# Patient Record
Sex: Female | Born: 1970 | Race: Black or African American | Hispanic: No | Marital: Single | Smoking: Never smoker
Health system: Southern US, Community
[De-identification: ages and names within clinical notes are randomized; demographics above are authoritative.]

## PROBLEM LIST (undated history)

## (undated) DIAGNOSIS — I10 Essential (primary) hypertension: Secondary | ICD-10-CM

## (undated) DIAGNOSIS — R7303 Prediabetes: Secondary | ICD-10-CM

## (undated) HISTORY — PX: ANKLE ARTHROSCOPY: SHX545

## (undated) HISTORY — DX: Essential (primary) hypertension: I10

## (undated) HISTORY — PX: WRIST ARTHROPLASTY: SHX1088

## (undated) HISTORY — PX: ABDOMINAL HYSTERECTOMY: SHX81

## (undated) HISTORY — DX: Prediabetes: R73.03

---

## 2000-08-08 ENCOUNTER — Other Ambulatory Visit: Admission: RE | Admit: 2000-08-08 | Discharge: 2000-08-08 | Payer: Self-pay | Admitting: Obstetrics and Gynecology

## 2000-09-18 ENCOUNTER — Ambulatory Visit (HOSPITAL_COMMUNITY): Admission: RE | Admit: 2000-09-18 | Discharge: 2000-09-18 | Payer: Self-pay | Admitting: Obstetrics and Gynecology

## 2000-09-18 ENCOUNTER — Encounter (INDEPENDENT_AMBULATORY_CARE_PROVIDER_SITE_OTHER): Payer: Self-pay | Admitting: Specialist

## 2000-12-31 ENCOUNTER — Other Ambulatory Visit: Admission: RE | Admit: 2000-12-31 | Discharge: 2000-12-31 | Payer: Self-pay | Admitting: Obstetrics and Gynecology

## 2001-10-30 ENCOUNTER — Other Ambulatory Visit: Admission: RE | Admit: 2001-10-30 | Discharge: 2001-10-30 | Payer: Self-pay | Admitting: Obstetrics and Gynecology

## 2004-04-24 ENCOUNTER — Other Ambulatory Visit: Admission: RE | Admit: 2004-04-24 | Discharge: 2004-04-24 | Payer: Self-pay | Admitting: Family Medicine

## 2005-07-08 ENCOUNTER — Other Ambulatory Visit: Admission: RE | Admit: 2005-07-08 | Discharge: 2005-07-08 | Payer: Self-pay | Admitting: Obstetrics and Gynecology

## 2005-07-17 ENCOUNTER — Other Ambulatory Visit: Admission: RE | Admit: 2005-07-17 | Discharge: 2005-07-17 | Payer: Self-pay | Admitting: Family Medicine

## 2006-04-09 ENCOUNTER — Encounter: Admission: RE | Admit: 2006-04-09 | Discharge: 2006-04-09 | Payer: Self-pay | Admitting: Obstetrics & Gynecology

## 2007-04-13 ENCOUNTER — Encounter: Admission: RE | Admit: 2007-04-13 | Discharge: 2007-04-13 | Payer: Self-pay | Admitting: *Deleted

## 2010-09-09 ENCOUNTER — Encounter: Payer: Self-pay | Admitting: Obstetrics & Gynecology

## 2011-01-04 NOTE — Op Note (Signed)
[facility]  Patient:    Brandy Dunn, Brandy Dunn                         MRN: [id number] Proc. Date: 09/18/00 Adm. Date:  [id number] Attending:  Michaele Offer CC:         Charles A. Tenny Craw, M.D.                           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  OPERATION PERFORMED:  LEEP of the cervix.  SURGEON:  Zenaida Niece, M.D.  ANESTHESIA:  Paracervical block with 2% lidocaine.  DESCRIPTION OF PROCEDURE:  After appropriate informed consent was obtained, the patient was taken to the minor procedure room and placed in high stirrups. An insulated speculum was inserted into the vagina and hooked up to suction. Vaginal fornices were prepped with Betadine and paracervical block was performed.  The cervix was then swabbed with acetic acid.  The cervix was found to have adequate anesthesia and there were no significant acetowhite areas.  A LEEP specimen was then removed with a fairly small loop to remove the entire transition zone.  This achieved good results with a fairly small specimen.  The entire LEEP bed was then coagulated with the ball electrode to assure hemostasis.  The endocervical canal was then probed with the uterine sound to maintain patency.  The patient tolerated the procedure well and was taken down from stirrups and discharged home in stable condition. DD:  09/18/00 TD:  09/18/00 Job: [id number]

## 2016-09-17 DIAGNOSIS — R87612 Low grade squamous intraepithelial lesion on cytologic smear of cervix (LGSIL): Secondary | ICD-10-CM | POA: Diagnosis not present

## 2016-10-09 DIAGNOSIS — R87610 Atypical squamous cells of undetermined significance on cytologic smear of cervix (ASC-US): Secondary | ICD-10-CM | POA: Diagnosis not present

## 2016-10-09 DIAGNOSIS — N912 Amenorrhea, unspecified: Secondary | ICD-10-CM | POA: Diagnosis not present

## 2016-11-04 DIAGNOSIS — M9904 Segmental and somatic dysfunction of sacral region: Secondary | ICD-10-CM | POA: Diagnosis not present

## 2016-11-04 DIAGNOSIS — M5137 Other intervertebral disc degeneration, lumbosacral region: Secondary | ICD-10-CM | POA: Diagnosis not present

## 2016-11-04 DIAGNOSIS — M9903 Segmental and somatic dysfunction of lumbar region: Secondary | ICD-10-CM | POA: Diagnosis not present

## 2016-12-29 DIAGNOSIS — S82841A Displaced bimalleolar fracture of right lower leg, initial encounter for closed fracture: Secondary | ICD-10-CM | POA: Diagnosis not present

## 2016-12-29 DIAGNOSIS — W1839XA Other fall on same level, initial encounter: Secondary | ICD-10-CM | POA: Diagnosis not present

## 2016-12-29 DIAGNOSIS — Z888 Allergy status to other drugs, medicaments and biological substances status: Secondary | ICD-10-CM | POA: Diagnosis not present

## 2016-12-29 DIAGNOSIS — S82851A Displaced trimalleolar fracture of right lower leg, initial encounter for closed fracture: Secondary | ICD-10-CM | POA: Diagnosis not present

## 2016-12-29 DIAGNOSIS — M25571 Pain in right ankle and joints of right foot: Secondary | ICD-10-CM | POA: Diagnosis not present

## 2016-12-30 DIAGNOSIS — S8261XA Displaced fracture of lateral malleolus of right fibula, initial encounter for closed fracture: Secondary | ICD-10-CM | POA: Diagnosis not present

## 2017-01-07 DIAGNOSIS — G8918 Other acute postprocedural pain: Secondary | ICD-10-CM | POA: Diagnosis not present

## 2017-01-07 DIAGNOSIS — S93421A Sprain of deltoid ligament of right ankle, initial encounter: Secondary | ICD-10-CM | POA: Diagnosis not present

## 2017-01-07 DIAGNOSIS — S82841A Displaced bimalleolar fracture of right lower leg, initial encounter for closed fracture: Secondary | ICD-10-CM | POA: Diagnosis not present

## 2017-01-22 DIAGNOSIS — S8261XD Displaced fracture of lateral malleolus of right fibula, subsequent encounter for closed fracture with routine healing: Secondary | ICD-10-CM | POA: Diagnosis not present

## 2017-03-24 DIAGNOSIS — S8261XD Displaced fracture of lateral malleolus of right fibula, subsequent encounter for closed fracture with routine healing: Secondary | ICD-10-CM | POA: Diagnosis not present

## 2017-03-31 DIAGNOSIS — M5137 Other intervertebral disc degeneration, lumbosacral region: Secondary | ICD-10-CM | POA: Diagnosis not present

## 2017-03-31 DIAGNOSIS — M9903 Segmental and somatic dysfunction of lumbar region: Secondary | ICD-10-CM | POA: Diagnosis not present

## 2017-03-31 DIAGNOSIS — M9904 Segmental and somatic dysfunction of sacral region: Secondary | ICD-10-CM | POA: Diagnosis not present

## 2017-04-07 DIAGNOSIS — M25671 Stiffness of right ankle, not elsewhere classified: Secondary | ICD-10-CM | POA: Diagnosis not present

## 2017-04-10 DIAGNOSIS — M25671 Stiffness of right ankle, not elsewhere classified: Secondary | ICD-10-CM | POA: Diagnosis not present

## 2017-04-15 DIAGNOSIS — M25671 Stiffness of right ankle, not elsewhere classified: Secondary | ICD-10-CM | POA: Diagnosis not present

## 2017-04-17 DIAGNOSIS — M25671 Stiffness of right ankle, not elsewhere classified: Secondary | ICD-10-CM | POA: Diagnosis not present

## 2017-04-18 DIAGNOSIS — M5137 Other intervertebral disc degeneration, lumbosacral region: Secondary | ICD-10-CM | POA: Diagnosis not present

## 2017-04-18 DIAGNOSIS — M9904 Segmental and somatic dysfunction of sacral region: Secondary | ICD-10-CM | POA: Diagnosis not present

## 2017-04-18 DIAGNOSIS — M9903 Segmental and somatic dysfunction of lumbar region: Secondary | ICD-10-CM | POA: Diagnosis not present

## 2017-04-22 DIAGNOSIS — M25671 Stiffness of right ankle, not elsewhere classified: Secondary | ICD-10-CM | POA: Diagnosis not present

## 2017-04-24 DIAGNOSIS — M25671 Stiffness of right ankle, not elsewhere classified: Secondary | ICD-10-CM | POA: Diagnosis not present

## 2017-04-28 DIAGNOSIS — S8261XD Displaced fracture of lateral malleolus of right fibula, subsequent encounter for closed fracture with routine healing: Secondary | ICD-10-CM | POA: Diagnosis not present

## 2017-04-29 DIAGNOSIS — M25671 Stiffness of right ankle, not elsewhere classified: Secondary | ICD-10-CM | POA: Diagnosis not present

## 2017-05-01 DIAGNOSIS — M25671 Stiffness of right ankle, not elsewhere classified: Secondary | ICD-10-CM | POA: Diagnosis not present

## 2017-05-06 DIAGNOSIS — M25671 Stiffness of right ankle, not elsewhere classified: Secondary | ICD-10-CM | POA: Diagnosis not present

## 2017-05-08 DIAGNOSIS — M25671 Stiffness of right ankle, not elsewhere classified: Secondary | ICD-10-CM | POA: Diagnosis not present

## 2017-05-13 DIAGNOSIS — M25671 Stiffness of right ankle, not elsewhere classified: Secondary | ICD-10-CM | POA: Diagnosis not present

## 2017-05-15 DIAGNOSIS — M25671 Stiffness of right ankle, not elsewhere classified: Secondary | ICD-10-CM | POA: Diagnosis not present

## 2017-05-20 DIAGNOSIS — M25671 Stiffness of right ankle, not elsewhere classified: Secondary | ICD-10-CM | POA: Diagnosis not present

## 2017-05-21 DIAGNOSIS — Z23 Encounter for immunization: Secondary | ICD-10-CM | POA: Diagnosis not present

## 2017-05-22 DIAGNOSIS — M25671 Stiffness of right ankle, not elsewhere classified: Secondary | ICD-10-CM | POA: Diagnosis not present

## 2017-05-26 DIAGNOSIS — S8261XD Displaced fracture of lateral malleolus of right fibula, subsequent encounter for closed fracture with routine healing: Secondary | ICD-10-CM | POA: Diagnosis not present

## 2017-05-26 DIAGNOSIS — Z09 Encounter for follow-up examination after completed treatment for conditions other than malignant neoplasm: Secondary | ICD-10-CM | POA: Diagnosis not present

## 2017-06-06 DIAGNOSIS — M9903 Segmental and somatic dysfunction of lumbar region: Secondary | ICD-10-CM | POA: Diagnosis not present

## 2017-06-06 DIAGNOSIS — M9904 Segmental and somatic dysfunction of sacral region: Secondary | ICD-10-CM | POA: Diagnosis not present

## 2017-06-06 DIAGNOSIS — M5137 Other intervertebral disc degeneration, lumbosacral region: Secondary | ICD-10-CM | POA: Diagnosis not present

## 2017-06-16 DIAGNOSIS — Z01419 Encounter for gynecological examination (general) (routine) without abnormal findings: Secondary | ICD-10-CM | POA: Diagnosis not present

## 2017-06-24 DIAGNOSIS — M9904 Segmental and somatic dysfunction of sacral region: Secondary | ICD-10-CM | POA: Diagnosis not present

## 2017-06-24 DIAGNOSIS — M5137 Other intervertebral disc degeneration, lumbosacral region: Secondary | ICD-10-CM | POA: Diagnosis not present

## 2017-06-24 DIAGNOSIS — M9903 Segmental and somatic dysfunction of lumbar region: Secondary | ICD-10-CM | POA: Diagnosis not present

## 2017-07-22 DIAGNOSIS — R030 Elevated blood-pressure reading, without diagnosis of hypertension: Secondary | ICD-10-CM | POA: Diagnosis not present

## 2017-07-23 DIAGNOSIS — M5137 Other intervertebral disc degeneration, lumbosacral region: Secondary | ICD-10-CM | POA: Diagnosis not present

## 2017-07-23 DIAGNOSIS — M9903 Segmental and somatic dysfunction of lumbar region: Secondary | ICD-10-CM | POA: Diagnosis not present

## 2017-07-23 DIAGNOSIS — M9904 Segmental and somatic dysfunction of sacral region: Secondary | ICD-10-CM | POA: Diagnosis not present

## 2017-08-13 DIAGNOSIS — M9903 Segmental and somatic dysfunction of lumbar region: Secondary | ICD-10-CM | POA: Diagnosis not present

## 2017-08-13 DIAGNOSIS — M5137 Other intervertebral disc degeneration, lumbosacral region: Secondary | ICD-10-CM | POA: Diagnosis not present

## 2017-08-13 DIAGNOSIS — M9904 Segmental and somatic dysfunction of sacral region: Secondary | ICD-10-CM | POA: Diagnosis not present

## 2017-08-14 DIAGNOSIS — M2021 Hallux rigidus, right foot: Secondary | ICD-10-CM | POA: Diagnosis not present

## 2017-12-22 DIAGNOSIS — R87610 Atypical squamous cells of undetermined significance on cytologic smear of cervix (ASC-US): Secondary | ICD-10-CM | POA: Diagnosis not present

## 2018-04-24 DIAGNOSIS — M5137 Other intervertebral disc degeneration, lumbosacral region: Secondary | ICD-10-CM | POA: Diagnosis not present

## 2018-04-24 DIAGNOSIS — M9904 Segmental and somatic dysfunction of sacral region: Secondary | ICD-10-CM | POA: Diagnosis not present

## 2018-04-24 DIAGNOSIS — M9903 Segmental and somatic dysfunction of lumbar region: Secondary | ICD-10-CM | POA: Diagnosis not present

## 2018-05-07 DIAGNOSIS — Z23 Encounter for immunization: Secondary | ICD-10-CM | POA: Diagnosis not present

## 2018-05-25 DIAGNOSIS — M5137 Other intervertebral disc degeneration, lumbosacral region: Secondary | ICD-10-CM | POA: Diagnosis not present

## 2018-05-25 DIAGNOSIS — M9903 Segmental and somatic dysfunction of lumbar region: Secondary | ICD-10-CM | POA: Diagnosis not present

## 2018-05-25 DIAGNOSIS — M9904 Segmental and somatic dysfunction of sacral region: Secondary | ICD-10-CM | POA: Diagnosis not present

## 2018-07-27 DIAGNOSIS — M5137 Other intervertebral disc degeneration, lumbosacral region: Secondary | ICD-10-CM | POA: Diagnosis not present

## 2018-07-27 DIAGNOSIS — M9904 Segmental and somatic dysfunction of sacral region: Secondary | ICD-10-CM | POA: Diagnosis not present

## 2018-07-27 DIAGNOSIS — M9903 Segmental and somatic dysfunction of lumbar region: Secondary | ICD-10-CM | POA: Diagnosis not present

## 2018-08-03 DIAGNOSIS — Z01419 Encounter for gynecological examination (general) (routine) without abnormal findings: Secondary | ICD-10-CM | POA: Diagnosis not present

## 2018-08-03 DIAGNOSIS — Z6841 Body Mass Index (BMI) 40.0 and over, adult: Secondary | ICD-10-CM | POA: Diagnosis not present

## 2018-08-04 ENCOUNTER — Other Ambulatory Visit: Payer: Self-pay | Admitting: Obstetrics & Gynecology

## 2018-08-04 DIAGNOSIS — R928 Other abnormal and inconclusive findings on diagnostic imaging of breast: Secondary | ICD-10-CM

## 2018-08-17 ENCOUNTER — Other Ambulatory Visit: Payer: Self-pay

## 2018-08-21 ENCOUNTER — Ambulatory Visit
Admission: RE | Admit: 2018-08-21 | Discharge: 2018-08-21 | Disposition: A | Discharge status: Home / Self Care | Payer: 59 | Source: Ambulatory Visit | Attending: Obstetrics & Gynecology | Admitting: Obstetrics & Gynecology

## 2018-08-21 DIAGNOSIS — R928 Other abnormal and inconclusive findings on diagnostic imaging of breast: Secondary | ICD-10-CM

## 2018-08-21 DIAGNOSIS — N6012 Diffuse cystic mastopathy of left breast: Secondary | ICD-10-CM | POA: Diagnosis not present

## 2018-08-31 DIAGNOSIS — R29898 Other symptoms and signs involving the musculoskeletal system: Secondary | ICD-10-CM | POA: Diagnosis not present

## 2018-08-31 DIAGNOSIS — M5430 Sciatica, unspecified side: Secondary | ICD-10-CM | POA: Diagnosis not present

## 2018-10-13 DIAGNOSIS — M67439 Ganglion, unspecified wrist: Secondary | ICD-10-CM | POA: Insufficient documentation

## 2018-10-13 DIAGNOSIS — M67432 Ganglion, left wrist: Secondary | ICD-10-CM | POA: Diagnosis not present

## 2018-10-28 DIAGNOSIS — D251 Intramural leiomyoma of uterus: Secondary | ICD-10-CM | POA: Diagnosis not present

## 2018-10-28 DIAGNOSIS — D252 Subserosal leiomyoma of uterus: Secondary | ICD-10-CM | POA: Diagnosis not present

## 2019-05-04 DIAGNOSIS — D1720 Benign lipomatous neoplasm of skin and subcutaneous tissue of unspecified limb: Secondary | ICD-10-CM | POA: Insufficient documentation

## 2019-08-17 ENCOUNTER — Other Ambulatory Visit: Payer: Self-pay | Admitting: Obstetrics & Gynecology

## 2019-08-17 DIAGNOSIS — R928 Other abnormal and inconclusive findings on diagnostic imaging of breast: Secondary | ICD-10-CM

## 2019-08-25 ENCOUNTER — Ambulatory Visit
Admission: RE | Admit: 2019-08-25 | Discharge: 2019-08-25 | Disposition: A | Discharge status: Home / Self Care | Payer: 59 | Source: Ambulatory Visit | Attending: Obstetrics & Gynecology | Admitting: Obstetrics & Gynecology

## 2019-08-25 ENCOUNTER — Other Ambulatory Visit: Payer: Self-pay

## 2019-08-25 DIAGNOSIS — R928 Other abnormal and inconclusive findings on diagnostic imaging of breast: Secondary | ICD-10-CM

## 2021-03-28 DIAGNOSIS — D219 Benign neoplasm of connective and other soft tissue, unspecified: Secondary | ICD-10-CM | POA: Insufficient documentation

## 2021-04-17 DIAGNOSIS — R1900 Intra-abdominal and pelvic swelling, mass and lump, unspecified site: Secondary | ICD-10-CM | POA: Insufficient documentation

## 2021-05-16 ENCOUNTER — Encounter (HOSPITAL_BASED_OUTPATIENT_CLINIC_OR_DEPARTMENT_OTHER): Payer: Self-pay | Admitting: Cardiovascular Disease

## 2021-05-16 ENCOUNTER — Other Ambulatory Visit: Payer: Self-pay

## 2021-05-16 ENCOUNTER — Ambulatory Visit (INDEPENDENT_AMBULATORY_CARE_PROVIDER_SITE_OTHER): Payer: 59 | Admitting: Cardiovascular Disease

## 2021-05-16 DIAGNOSIS — E7800 Pure hypercholesterolemia, unspecified: Secondary | ICD-10-CM | POA: Insufficient documentation

## 2021-05-16 DIAGNOSIS — I10 Essential (primary) hypertension: Secondary | ICD-10-CM | POA: Diagnosis not present

## 2021-05-16 HISTORY — DX: Essential (primary) hypertension: I10

## 2021-05-16 HISTORY — DX: Morbid (severe) obesity due to excess calories: E66.01

## 2021-05-16 HISTORY — DX: Pure hypercholesterolemia, unspecified: E78.00

## 2021-05-16 NOTE — Progress Notes (Addendum)
Advanced Hypertension Clinic Initial Assessment:    Date:  03/28/2022   ID:  Brandy Dunn, DOB [birth year], MRN [id number]  PCP:  [facility]  Cardiologist:  None  Nephrologist:  Referring MD: Servando Salina, MD   CC: Hypertension  History of Present Illness:    Brandy Dunn is a 50 y.o. female with a hx of hypertension, hyperlipidemia, and pre-diabetes, here to establish care in the Advanced Hypertension Clinic.  On 04/17/2021 she underwent a total abdominal hysterectomy with bilateral salpingectomy for benign uterine fibroids.  Today, she is feeling pretty good overall. She notes that her blood pressure will elevate following significant body changes, such as fracturing her ankle and her recent hysterectomy. Lately, her blood pressure has returned to the 130s/85. She began antihypertensives this past year (amlodipine). Previously she was on HCTZ for a short time, but her blood pressure dropped as low as 96/66 and she did not feel well. Immediately following her hysterectomy she had LE edema, but this resolved relatively quickly and has not recurred. In her diet, she has cut salt as much as possible. She works night shifts, and usually drinks caffeinated tea. She is not a smoker and does not drink alcohol. For pain management she takes tylenol. Dr. Garwin Brothers referred her to the weight loss clinic, and she has been working with them for 8 weeks. She has lost 23 lbs following a healthy diet with three meals and three snacks a day. For exercise she normally uses the treadmill or elliptical, and sometimes lifts weights. Prior to Covid restrictions, she exercised 4-5 times a week. Now she participates in formal exercise about 3 times a week. She generally feels well with exertion. At rest or with climbing stairs she does note some shortness of breath. She endorses snoring if she is very tired. She denies any palpitations, or chest pain. No lightheadedness, headaches, syncope,  orthopnea, or PND.    Previous antihypertensives: HCTZ - Low blood pressure, cramps   Past Medical History:  Diagnosis Date   Essential hypertension 05/16/2021   HTN (hypertension)    Morbid obesity ([place]) 05/16/2021   Pre-diabetes    Pure hypercholesterolemia 05/16/2021    Past Surgical History:  Procedure Laterality Date   ABDOMINAL HYSTERECTOMY     ANKLE ARTHROSCOPY     WRIST ARTHROPLASTY      Current Medications: Current Meds  Medication Sig   amLODipine (NORVASC) 5 MG tablet Take 5 mg by mouth daily.   Cyanocobalamin (VITAMIN B-12 IJ) Inject 1 Dose as directed once a week.   ELDERBERRY PO Take 1 tablet by mouth in the morning and at bedtime.   Glucosamine-Chondroitin (MOVE FREE PO) Take 1 tablet by mouth every evening.   Multiple Vitamin (MULTIVITAMIN) tablet Take 1 tablet by mouth every evening.   Multiple Vitamins-Minerals (ICAPS AREDS 2 PO) Take 1 tablet by mouth in the morning and at bedtime.   Omega-3 Fatty Acids (FISH OIL) 1000 MG CAPS Take 1 capsule by mouth every evening.   Probiotic Product (PROBIOTIC-10 PO) Take 1 capsule by mouth every evening.   rosuvastatin (CRESTOR) 5 MG tablet Take 5 mg by mouth daily.     Allergies:   Nickel   Social History   Socioeconomic History   Marital status: Single    Spouse name: Not on file   Number of children: Not on file   Years of education: Not on file   Highest education level: Not on file  Occupational History  Not on file  Tobacco Use   Smoking status: Never   Smokeless tobacco: Never  Substance and Sexual Activity   Alcohol use: Not Currently   Drug use: Not on file   Sexual activity: Not on file  Other Topics Concern   Not on file  Social History Narrative   Not on file   Social Determinants of Health   Financial Resource Strain: Low Risk  (05/16/2021)   Overall Financial Resource Strain (CARDIA)    Difficulty of Paying Living Expenses: Not hard at all  Food Insecurity: No Food Insecurity  (05/16/2021)   Hunger Vital Sign    Worried About Running Out of Food in the Last Year: Never true    Ran Out of Food in the Last Year: Never true  Transportation Needs: No Transportation Needs (05/16/2021)   PRAPARE - Hydrologist (Medical): No    Lack of Transportation (Non-Medical): No  Physical Activity: Inactive (05/16/2021)   Exercise Vital Sign    Days of Exercise per Week: 0 days    Minutes of Exercise per Session: 0 min  Stress: Not on file  Social Connections: Not on file     Family History: The patient's family history includes Asthma in her maternal aunt; Breast cancer (age of onset: 41) in her cousin; Breast cancer (age of onset: 70) in her maternal aunt; Diabetes in her father; Hypertension in her brother, father, mother, and sister; Kidney failure in her father; Transient ischemic attack in her father.  ROS:   Please see the history of present illness.    (+) Snoring (+) Shortness of breath All other systems reviewed and are negative.  EKGs/Labs/Other Studies Reviewed:    No prior CV studies available.   EKG:   05/16/2021: Sinus rhythm. Rate 80 bpm.  Recent Labs: 01/05/2022: Hemoglobin 13.7; Platelets 316 03/26/2022: ALT 14; BUN 10; Creatinine, Ser 0.78; Potassium 4.0; Sodium 138   Recent Lipid Panel    Component Value Date/Time   CHOL 187 03/26/2022 0918   TRIG 75 03/26/2022 0918   HDL 45 03/26/2022 0918   CHOLHDL 4.2 03/26/2022 0918   LDLCALC 128 (H) 03/26/2022 0918    Physical Exam:   VS:  BP 128/84 (BP Location: Left Arm, Patient Position: Sitting, Cuff Size: Large)   Pulse 80   Ht 5\' 4"  (1.626 m)   Wt 227 lb 14.4 oz (103.4 kg)   BMI 39.12 kg/m  , BMI Body mass index is 39.12 kg/m. GENERAL:  Well appearing HEENT: Pupils equal round and reactive, fundi not visualized, oral mucosa unremarkable NECK:  No jugular venous distention, waveform within normal limits, carotid upstroke brisk and symmetric, no bruits,  +thyromegaly LUNGS:  Clear to auscultation bilaterally HEART:  RRR.  PMI not displaced or sustained,S1 and S2 within normal limits, no S3, no S4, no clicks, no rubs, no murmurs ABD:  Flat, positive bowel sounds normal in frequency in pitch, no bruits, no rebound, no guarding, no midline pulsatile mass, no hepatomegaly, no splenomegaly EXT:  2 plus pulses throughout, no edema, no cyanosis no clubbing SKIN:  No rashes no nodules NEURO:  Cranial nerves II through XII grossly intact, motor grossly intact throughout PSYCH:  Cognitively intact, oriented to person place and time   ASSESSMENT/PLAN:    Essential hypertension Pressure was initially elevated but much better on repeat.  She is working with the weight loss clinic and has already lost 20 pounds.  She has increased her exercise and is making  substantial changes to her diet.  Continue amlodipine for now.  She seems to be tolerating that much better than hydrochlorothiazide.  Pure hypercholesterolemia She was started on rosuvastatin.  She reports that her total cholesterol is 201 but she does not know any of the other values prior to starting this medication.  Therefore I cannot calculate her ASCVD 10-year risk now.  She is going to continue it for now.  Given that she is really working on diet and weight loss she may not need it in the future.  We will have her follow-up in 6 months.  For 2 weeks prior she will not take the rosuvastatin we will get fasting lipids and a CMP right before the appointment.  Based on those values we can make a 10-year risk calculation and determine if she needs to continue the statin.  Morbid obesity ([place]) Continue working on diet and exercise.   Screening for Secondary Hypertension:     05/16/2021   11:28 AM  Causes  Drugs/Herbals Screened     - Comments tea when working, limits salt  Renovascular HTN N/A  Sleep Apnea Screened     - Comments minimal symptoms  Thyroid Disease Screened     - Comments check  TSH  Hyperaldosteronism N/A  Pheochromocytoma N/A  Cushing's Syndrome N/A  Hyperparathyroidism N/A  Coarctation of the Aorta Screened     - Comments BP symmetric  Compliance Screened    Relevant Labs/Studies:    Latest Ref Rng & Units 03/26/2022    9:18 AM 01/05/2022    8:02 PM  Basic Labs  Sodium 134 - 144 mmol/L 138  137   Potassium 3.5 - 5.2 mmol/L 4.0  3.8   Creatinine 0.57 - 1.00 mg/dL 0.78  0.71                    Disposition:    FU with Ossie Yebra C. Oval Linsey, MD, [facility] in 6 months.   Medication Adjustments/Labs and Tests Ordered: Current medicines are reviewed at length with the patient today.  Concerns regarding medicines are outlined above.   Orders Placed This Encounter  Procedures   Lipid panel   Comprehensive metabolic panel   EKG 27-NTZG    No orders of the defined types were placed in this encounter.    I,Mathew Stumpf,acting as a Education administrator for Skeet Latch, MD.,have documented all relevant documentation on the behalf of Skeet Latch, MD,as directed by  Skeet Latch, MD while in the presence of Skeet Latch, MD.  I, [place] Oval Linsey, MD have reviewed all documentation for this visit.  The documentation of the exam, diagnosis, procedures, and orders on 03/28/2022 are all accurate and complete.   Signed, Skeet Latch, MD  03/28/2022 3:29 PM    [place]

## 2021-05-16 NOTE — Assessment & Plan Note (Signed)
Pressure was initially elevated but much better on repeat.  She is working with the weight loss clinic and has already lost 20 pounds.  She has increased her exercise and is making substantial changes to her diet.  Continue amlodipine for now.  She seems to be tolerating that much better than hydrochlorothiazide.

## 2021-05-16 NOTE — Assessment & Plan Note (Signed)
She was started on rosuvastatin.  She reports that her total cholesterol is 201 but she does not know any of the other values prior to starting this medication.  Therefore I cannot calculate her ASCVD 10-year risk now.  She is going to continue it for now.  Given that she is really working on diet and weight loss she may not need it in the future.  We will have her follow-up in 6 months.  For 2 weeks prior she will not take the rosuvastatin we will get fasting lipids and a CMP right before the appointment.  Based on those values we can make a 10-year risk calculation and determine if she needs to continue the statin.

## 2021-05-16 NOTE — Assessment & Plan Note (Signed)
Continue working on diet and exercise.

## 2021-05-16 NOTE — Patient Instructions (Addendum)
Medication Instructions:  Your physician recommends that you continue on your current medications as directed. Please refer to the Current Medication list given to you today.   *If you need a refill on your cardiac medications before your next appointment, please call your pharmacy*  Lab Work: FASTING LP/CMET IN 6 MONTHS FEW DAYS PRIOR TO FOLLOW UP   If you have labs (blood work) drawn today and your tests are completely normal, you will receive your results only by: MyChart Message (if you have MyChart) OR A paper copy in the mail If you have any lab test that is abnormal or we need to change your treatment, we will call you to review the results.  Testing/Procedures: NONE   Follow-Up: At [facility], you and your health needs are our priority.  As part of our continuing mission to provide you with exceptional heart care, we have created designated Provider Care Teams.  These Care Teams include your primary Cardiologist (physician) and Advanced Practice Providers (APPs -  Physician Assistants and Nurse Practitioners) who all work together to provide you with the care you need, when you need it.  We recommend signing up for the patient portal called "MyChart".  Sign up information is provided on this After Visit Summary.  MyChart is used to connect with patients for Virtual Visits (Telemedicine).  Patients are able to view lab/test results, encounter notes, upcoming appointments, etc.  Non-urgent messages can be sent to your provider as well.   To learn more about what you can do with MyChart, go to [email or web address].    Your next appointment:   6 month(s)  The format for your next appointment:   In Person  Provider:   Skeet Latch, MD   Other Instructions  HOLD YOUR ROSUVASTATIN FOR 2 WEEKS PRIOR TO [place]

## 2021-06-04 ENCOUNTER — Ambulatory Visit (HOSPITAL_BASED_OUTPATIENT_CLINIC_OR_DEPARTMENT_OTHER): Payer: 59 | Admitting: Cardiovascular Disease

## 2021-09-11 ENCOUNTER — Other Ambulatory Visit: Payer: Self-pay | Admitting: Family Medicine

## 2021-09-11 DIAGNOSIS — Z1231 Encounter for screening mammogram for malignant neoplasm of breast: Secondary | ICD-10-CM

## 2021-09-28 ENCOUNTER — Ambulatory Visit
Admission: RE | Admit: 2021-09-28 | Discharge: 2021-09-28 | Disposition: A | Discharge status: Home / Self Care | Payer: 59 | Source: Ambulatory Visit | Attending: Family Medicine | Admitting: Family Medicine

## 2021-09-28 ENCOUNTER — Other Ambulatory Visit: Payer: Self-pay

## 2021-09-28 DIAGNOSIS — Z1231 Encounter for screening mammogram for malignant neoplasm of breast: Secondary | ICD-10-CM

## 2021-11-05 ENCOUNTER — Telehealth: Payer: Self-pay | Admitting: Cardiovascular Disease

## 2021-11-05 NOTE — Telephone Encounter (Signed)
Responded to patient via mychart- has not been seen in our office since 05/16/21. Could be a different practice that is adjusting medications!  ?

## 2021-11-05 NOTE — Telephone Encounter (Signed)
?  Pt said, she supposed to stopped one of her meds but she forgot and medication it was, she also lost her AVS. She would like to speak with the nurse to give her the instruction again.  ?

## 2021-11-08 ENCOUNTER — Ambulatory Visit (HOSPITAL_BASED_OUTPATIENT_CLINIC_OR_DEPARTMENT_OTHER): Payer: 59 | Admitting: Cardiovascular Disease

## 2022-01-05 ENCOUNTER — Other Ambulatory Visit: Payer: Self-pay

## 2022-01-05 ENCOUNTER — Emergency Department (HOSPITAL_COMMUNITY)
Admission: EM | Admit: 2022-01-05 | Discharge: 2022-01-06 | Disposition: A | Discharge status: Home / Self Care | Payer: 59 | Attending: Emergency Medicine | Admitting: Emergency Medicine

## 2022-01-05 ENCOUNTER — Encounter (HOSPITAL_COMMUNITY): Payer: Self-pay | Admitting: Emergency Medicine

## 2022-01-05 DIAGNOSIS — Z79899 Other long term (current) drug therapy: Secondary | ICD-10-CM | POA: Insufficient documentation

## 2022-01-05 DIAGNOSIS — R1084 Generalized abdominal pain: Secondary | ICD-10-CM | POA: Insufficient documentation

## 2022-01-05 DIAGNOSIS — R197 Diarrhea, unspecified: Secondary | ICD-10-CM | POA: Insufficient documentation

## 2022-01-05 DIAGNOSIS — I10 Essential (primary) hypertension: Secondary | ICD-10-CM | POA: Diagnosis not present

## 2022-01-05 DIAGNOSIS — R112 Nausea with vomiting, unspecified: Secondary | ICD-10-CM | POA: Diagnosis not present

## 2022-01-05 DIAGNOSIS — R1033 Periumbilical pain: Secondary | ICD-10-CM | POA: Diagnosis present

## 2022-01-05 LAB — COMPREHENSIVE METABOLIC PANEL
ALT: 20 U/L (ref 0–44)
AST: 24 U/L (ref 15–41)
Albumin: 4.4 g/dL (ref 3.5–5.0)
Alkaline Phosphatase: 73 U/L (ref 38–126)
Anion gap: 9 (ref 5–15)
BUN: 13 mg/dL (ref 6–20)
CO2: 24 mmol/L (ref 22–32)
Calcium: 9.3 mg/dL (ref 8.9–10.3)
Chloride: 104 mmol/L (ref 98–111)
Creatinine, Ser: 0.71 mg/dL (ref 0.44–1.00)
GFR, Estimated: >60 mL/min (ref >60)
Glucose, Bld: 177 mg/dL — ABNORMAL HIGH (ref 70–99)
Potassium: 3.8 mmol/L (ref 3.5–5.1)
Sodium: 137 mmol/L (ref 135–145)
Total Bilirubin: 0.7 mg/dL (ref 0.3–1.2)
Total Protein: 8.4 g/dL — ABNORMAL HIGH (ref 6.5–8.1)

## 2022-01-05 LAB — CBC
HCT: 40.7 % (ref 36.0–46.0)
Hemoglobin: 13.7 g/dL (ref 12.0–15.0)
MCH: 30 pg (ref 26.0–34.0)
MCHC: 33.7 g/dL (ref 30.0–36.0)
MCV: 89.1 fL (ref 80.0–100.0)
Platelets: 316 10*3/uL (ref 150–400)
RBC: 4.57 MIL/uL (ref 3.87–5.11)
RDW: 12.9 % (ref 11.5–15.5)
WBC: 9.1 10*3/uL (ref 4.0–10.5)
nRBC: 0 % (ref 0.0–0.2)

## 2022-01-05 LAB — URINALYSIS, ROUTINE W REFLEX MICROSCOPIC
Bacteria, UA: NONE SEEN
Bilirubin Urine: NEGATIVE
Glucose, UA: NEGATIVE mg/dL
Hgb urine dipstick: NEGATIVE
Ketones, ur: 20 mg/dL — AB
Leukocytes,Ua: NEGATIVE
Nitrite: NEGATIVE
Protein, ur: 100 mg/dL — AB
Specific Gravity, Urine: 1.019 (ref 1.005–1.030)
pH: 9 — ABNORMAL HIGH (ref 5.0–8.0)

## 2022-01-05 LAB — LIPASE, BLOOD: Lipase: 27 U/L (ref 11–51)

## 2022-01-05 MED ORDER — ONDANSETRON HCL 4 MG PO TABS: 4.0000 mg | ORAL_TABLET | Freq: Four times a day (QID) | ORAL | 0 refills | Status: DC

## 2022-01-05 MED ORDER — DICYCLOMINE HCL 20 MG PO TABS: 20.0000 mg | ORAL_TABLET | Freq: Two times a day (BID) | ORAL | 0 refills | Status: DC

## 2022-01-05 MED ORDER — DICYCLOMINE HCL 10 MG PO CAPS
10.0000 mg | ORAL_CAPSULE | Freq: Once | ORAL | Status: AC
  Administered 2022-01-05: 10 mg via ORAL
  Filled 2022-01-05: qty 1

## 2022-01-05 MED ORDER — LIDOCAINE VISCOUS HCL 2 % MT SOLN
15.0000 mL | Freq: Once | OROMUCOSAL | Status: AC
  Administered 2022-01-05: 15 mL via ORAL
  Filled 2022-01-05: qty 15

## 2022-01-05 MED ORDER — ALUM & MAG HYDROXIDE-SIMETH 200-200-20 MG/5ML PO SUSP
30.0000 mL | Freq: Once | ORAL | Status: AC
  Administered 2022-01-05: 30 mL via ORAL
  Filled 2022-01-05: qty 30

## 2022-01-05 MED ORDER — ONDANSETRON 4 MG PO TBDP
4.0000 mg | ORAL_TABLET | Freq: Once | ORAL | Status: AC
  Administered 2022-01-05: 4 mg via ORAL
  Filled 2022-01-05: qty 1

## 2022-01-05 NOTE — ED Notes (Signed)
Pt ambulated to restroom in lobby without assistance.

## 2022-01-05 NOTE — ED Notes (Signed)
Attempted to assess patient's vitals, patient still in restroom.

## 2022-01-05 NOTE — Discharge Instructions (Signed)
You were seen in the emergency department today for abdominal pain. You labs are all reassuring. We gave you a few medications for nausea and cramping which has improved your symptoms. I am prescribing you two medications. Zofran for nausea and Bentyl for your diarrhea and cramping symptoms. You likely have a viral gastroenteritis. Please return for worsening symptoms, or abdominal pain with fever. Please follow-up with your primary care provider.

## 2022-01-05 NOTE — ED Triage Notes (Addendum)
Per EMS pt has had cramping abdominal pain with n/v/d for 4 hours.

## 2022-01-05 NOTE — ED Provider Notes (Signed)
Brandy Dunn DEPT Provider Note   CSN: [id number] Arrival date & time: 01/05/22  1949     History  Chief Complaint  Patient presents with   Abdominal Pain    Brandy Dunn is a 51 y.o. female. With past medical history of hypertension, hypercholesterolemia, obesity who presents to the emergency department with abdominal pain.   States pain began this afternoon around 1PM. She describes it as cramping or twisting pain that was intermittent. States it is in the middle of her abdomen. She had associated nausea and vomiting x5. She also had two episodes of diarrhea. No hematemesis, hematochezia or melena. She denies urinary or vaginal symptoms. Denies fevers. Is passing gas. She states she has had these same symptoms previously after eating popcorn. States it only happened after hysterectomy. She did not have popcorn today. Denies recent alcohol use. No marijuana use. She had colonoscopy on Tuesday and states it was normal.     Abdominal Pain Associated symptoms: diarrhea, nausea and vomiting   Associated symptoms: no dysuria, no fever and no vaginal discharge       Home Medications Prior to Admission medications   Medication Sig Start Date End Date Taking? Authorizing Provider  amLODipine (NORVASC) 5 MG tablet Take 5 mg by mouth daily. 04/26/21   [provider]  Cyanocobalamin (VITAMIN B-12 IJ) Inject 1 Dose as directed once a week.    [provider]  ELDERBERRY PO Take 1 tablet by mouth in the morning and at bedtime.    [provider]  Glucosamine-Chondroitin (MOVE FREE PO) Take 1 tablet by mouth every evening.    [provider]  Multiple Vitamin (MULTIVITAMIN) tablet Take 1 tablet by mouth every evening.    [provider]  Multiple Vitamins-Minerals (ICAPS AREDS 2 PO) Take 1 tablet by mouth in the morning and at bedtime.    [provider]  Omega-3 Fatty Acids (FISH OIL) 1000 MG CAPS Take 1  capsule by mouth every evening.    [provider]  Probiotic Product (PROBIOTIC-10 PO) Take 1 capsule by mouth every evening.    [provider]  rosuvastatin (CRESTOR) 5 MG tablet Take 5 mg by mouth daily. 04/26/21   [provider]      Allergies    Nickel    Review of Systems   Review of Systems  Constitutional:  Negative for fever.  Gastrointestinal:  Positive for abdominal pain, diarrhea, nausea and vomiting.  Genitourinary:  Negative for dysuria and vaginal discharge.  All other systems reviewed and are negative.  Physical Exam Updated Vital Signs BP (!) 194/114 (BP Location: Right Arm)   Pulse 88   Temp 97.7 F (36.5 C) (Oral)   Resp 18   Ht '5\' 4"'$  (1.626 m)   Wt 102.1 kg   SpO2 100%   BMI 38.62 kg/m  Physical Exam Vitals and nursing note reviewed.  Constitutional:      General: She is not in acute distress.    Appearance: Normal appearance. She is well-developed. She is obese. She is not ill-appearing or toxic-appearing.  HENT:     Head: Normocephalic and atraumatic.     Mouth/Throat:     Mouth: Mucous membranes are moist.     Pharynx: Oropharynx is clear.  Eyes:     General: No scleral icterus.    Extraocular Movements: Extraocular movements intact.  Cardiovascular:     Rate and Rhythm: Normal rate and regular rhythm.     Heart sounds:  Normal heart sounds. No murmur heard. Pulmonary:     Effort: Pulmonary effort is normal. No respiratory distress.     Breath sounds: Normal breath sounds.  Abdominal:     General: Abdomen is protuberant. Bowel sounds are normal. There is no distension.     Palpations: Abdomen is soft.     Tenderness: There is abdominal tenderness in the periumbilical area. There is no guarding or rebound. Negative signs include Murphy's sign and McBurney's sign.     Comments: Only mild abdominal tenderness   Skin:    General: Skin is warm and dry.     Capillary Refill: Capillary refill takes less than 2 seconds.   Neurological:     General: No focal deficit present.     Mental Status: She is alert and oriented to person, place, and time.  Psychiatric:        Mood and Affect: Mood normal.        Behavior: Behavior normal.    ED Results / Procedures / Treatments   Labs (all labs ordered are listed, but only abnormal results are displayed) Labs Reviewed  COMPREHENSIVE METABOLIC PANEL - Abnormal; Notable for the following components:      Result Value   Glucose, Bld 177 (*)    Total Protein 8.4 (*)    All other components within normal limits  URINALYSIS, ROUTINE W REFLEX MICROSCOPIC - Abnormal; Notable for the following components:   APPearance CLOUDY (*)    pH 9.0 (*)    Ketones, ur 20 (*)    Protein, ur 100 (*)    All other components within normal limits  LIPASE, BLOOD  CBC   EKG None  Radiology No results found.  Procedures Procedures   Medications Ordered in ED Medications  alum & mag hydroxide-simeth (MAALOX/MYLANTA) 200-200-20 MG/5ML suspension 30 mL (has no administration in time range)    And  lidocaine (XYLOCAINE) 2 % viscous mouth solution 15 mL (has no administration in time range)  ondansetron (ZOFRAN-ODT) disintegrating tablet 4 mg (has no administration in time range)  dicyclomine (BENTYL) capsule 10 mg (has no administration in time range)    ED Course/ Medical Decision Making/ A&P                           Medical Decision Making Amount and/or Complexity of Data Reviewed Labs: ordered.  Risk OTC drugs. Prescription drug management.  This patient presents to the ED for concern of abdominal pain, this involves an extensive number of treatment options, and is a complaint that carries with it a high risk of complications and morbidity.  The differential diagnosis includes Acute hepatobiliary disease, pancreatitis, appendicitis, PUD, gastritis, SBO, diverticulitis, colitis, viral gastroenteritis, Crohn's, UC, PID, TOA, torsion, cystitis, pyelonephritis   Co  morbidities that complicate the patient evaluation Obesity, hypertension  Additional history obtained:  Additional history obtained from: none  External records from outside source obtained and reviewed including: recent colon cancer screening   Lab Results: I personally ordered, reviewed, and interpreted labs. Pertinent results include: CMP without electrolyte, AKI, transaminitis  UA without UTI  Urine pregnancy negative  Lipase 27, negative  CBC without leukocytosis or anemia   Medications  I ordered medication including GI cocktail, bentyl, zofran for abdominal pain, nausea, cramping Reevaluation of the patient after medication shows that patient improved -I reviewed the patient's home medications and did not make adjustments. -I did  prescribe new home medications.  Tests Considered: CT abdomen,  pelvis. Do not feel that her symptomology, workup warrant imaging at this time  SDH None identified  ED Course:  51 year old female who presents to the emergency department with abdominal pain.  Physical exam is remarkable for only some mild abdominal tenderness to palpation without guarding or rebound.  No peritonitis abdomen.  She is well-appearing and nontoxic in appearance.  Her vital signs are stable and she is afebrile. Labs are unremarkable Based on her history and physical, do not think that she warrants CT imaging at this time. Symptoms are most consistent with a gastroenteritis. Based on her physical exam and work-up I have low suspicion for acute hepatobiliary disease.  Lipase is negative, doubt acute pancreatitis.  She does not have fever, leukocytosis, hematochezia concerning for diverticulitis. Symptoms inconsistent with torsion, PID, TOA.  UA without UTI. Doubt pyelo  Not pregnant.doubt ectopic.  Given GI cocktail, bentyl, zofran with resolution of symptoms. Will give bentyl and zofran as needed for home. She's agreeable to this plan. Given return precautions for  worsening symptoms, or fever with abdominal pain.  After consideration of the diagnostic results and the patients response to treatment, I feel that the patent would benefit from discharge. The patient has been appropriately medically screened and/or stabilized in the ED. I have low suspicion for any other emergent medical condition which would require further screening, evaluation or treatment in the ED or require inpatient management. The patient is overall well appearing and non-toxic in appearance. They are hemodynamically stable at time of discharge.   Final Clinical Impression(s) / ED Diagnoses Final diagnoses:  Generalized abdominal pain    Rx / DC Orders ED Discharge Orders          Ordered    dicyclomine (BENTYL) 20 MG tablet  2 times daily        01/05/22 2337    ondansetron (ZOFRAN) 4 MG tablet  Every 6 hours        01/05/22 2337              Mickie Hillier, PA-C 01/09/22 2205    Drenda Freeze, MD 01/10/22 [phone]

## 2022-01-28 ENCOUNTER — Telehealth: Payer: Self-pay | Admitting: Cardiovascular Disease

## 2022-01-28 NOTE — Telephone Encounter (Signed)
RN returned call to patient and provided the information that patient is due for Lipids/ CMP a few days prior to upcoming appointment. Patient is told to hold Rosuvastatin for two weeks prior to upcoming labs. Patient verbalizes understanding.

## 2022-01-28 NOTE — Telephone Encounter (Signed)
Patient is requesting to clarify instructions for her BP and cholesterol medications. Please advise.

## 2022-03-06 ENCOUNTER — Ambulatory Visit (HOSPITAL_BASED_OUTPATIENT_CLINIC_OR_DEPARTMENT_OTHER): Payer: 59 | Admitting: Cardiovascular Disease

## 2022-03-26 LAB — COMPREHENSIVE METABOLIC PANEL
ALT: 14 IU/L (ref 0–32)
AST: 16 IU/L (ref 0–40)
Albumin/Globulin Ratio: 1.4 (ref 1.2–2.2)
Albumin: 4.1 g/dL (ref 3.8–4.9)
Alkaline Phosphatase: 82 IU/L (ref 44–121)
BUN/Creatinine Ratio: 13 (ref 9–23)
BUN: 10 mg/dL (ref 6–24)
Bilirubin Total: 1 mg/dL (ref 0.0–1.2)
CO2: 23 mmol/L (ref 20–29)
Calcium: 9.3 mg/dL (ref 8.7–10.2)
Chloride: 102 mmol/L (ref 96–106)
Creatinine, Ser: 0.78 mg/dL (ref 0.57–1.00)
Globulin, Total: 3 g/dL (ref 1.5–4.5)
Glucose: 93 mg/dL (ref 70–99)
Potassium: 4 mmol/L (ref 3.5–5.2)
Sodium: 138 mmol/L (ref 134–144)
Total Protein: 7.1 g/dL (ref 6.0–8.5)
eGFR: 91 mL/min/{1.73_m2} (ref >59)

## 2022-03-26 LAB — LIPID PANEL
Chol/HDL Ratio: 4.2 ratio (ref 0.0–4.4)
Cholesterol, Total: 187 mg/dL (ref 100–199)
HDL: 45 mg/dL (ref >39)
LDL Chol Calc (NIH): 128 mg/dL — ABNORMAL HIGH (ref 0–99)
Triglycerides: 75 mg/dL (ref 0–149)
VLDL Cholesterol Cal: 14 mg/dL (ref 5–40)

## 2022-03-28 ENCOUNTER — Encounter (HOSPITAL_BASED_OUTPATIENT_CLINIC_OR_DEPARTMENT_OTHER): Payer: Self-pay | Admitting: Cardiovascular Disease

## 2022-03-28 ENCOUNTER — Ambulatory Visit (INDEPENDENT_AMBULATORY_CARE_PROVIDER_SITE_OTHER): Payer: 59 | Admitting: Cardiovascular Disease

## 2022-03-28 DIAGNOSIS — I10 Essential (primary) hypertension: Secondary | ICD-10-CM

## 2022-03-28 DIAGNOSIS — E7800 Pure hypercholesterolemia, unspecified: Secondary | ICD-10-CM | POA: Diagnosis not present

## 2022-03-28 MED ORDER — ROSUVASTATIN CALCIUM 10 MG PO TABS: 10.0000 mg | ORAL_TABLET | Freq: Every day | ORAL | 3 refills | Status: DC

## 2022-03-28 NOTE — Assessment & Plan Note (Signed)
Ms. back has struggled with her weight.  She continues to gain weight.  She is going to work on increasing her diet and exercise.  We will also prescribe Ozempic as she has a history of prediabetes.  Start with 0.25 mg weekly for 4 weeks then 0.5 mg weekly for 4 weeks then 1 mg weekly.

## 2022-03-28 NOTE — Assessment & Plan Note (Signed)
Her lipids are going in the wrong direction.  Her LDL increased from 80 to 128.  Will increase rosuvastatin to 10 mg.  Check fasting lipids and a CMP in 2 to 3 months.  Work on diet and exercise.

## 2022-03-28 NOTE — Patient Instructions (Signed)
Medication Instructions:  INCREASE ROSUVASTATIN TO 10 MG DAILY   *If you need a refill on your cardiac medications before your next appointment, please call your pharmacy*   Lab Work: FASTING LP/CEMT IN 3 MONTHS FEW DAYS PRIOR TO FOLLOW UP   If you have labs (blood work) drawn today and your tests are completely normal, you will receive your results only by: [place] (if you have MyChart) OR A paper copy in the mail If you have any lab test that is abnormal or we need to change your treatment, we will call you to review the results.  Testing/Procedures: NONE  Follow-Up: At [facility], you and your health needs are our priority.  As part of our continuing mission to provide you with exceptional heart care, we have created designated Provider Care Teams.  These Care Teams include your primary Cardiologist (physician) and Advanced Practice Providers (APPs -  Physician Assistants and Nurse Practitioners) who all work together to provide you with the care you need, when you need it.  We recommend signing up for the patient portal called "MyChart".  Sign up information is provided on this After Visit Summary.  MyChart is used to connect with patients for Virtual Visits (Telemedicine).  Patients are able to view lab/test results, encounter notes, upcoming appointments, etc.  Non-urgent messages can be sent to your provider as well.   To learn more about what you can do with MyChart, go to [email or web address].    Your next appointment:   3 month(s)  The format for your next appointment:   In Person  Provider:   Skeet Latch, MD   [facility] [place]

## 2022-03-28 NOTE — Progress Notes (Signed)
Cardiology Office Note:    Date:  03/28/2022   ID:  DELBERTA Dunn, DOB April 12, 1971, MRN [id number]  PCP:  [facility]  Cardiologist:  None  Nephrologist:  Referring MD: Kathyrn Lass, MD   CC: Hypertension  History of Present Illness:    Brandy Dunn is a 51 y.o. female with a hx of hypertension, hyperlipidemia, and pre-diabetes, here for follow up. She was first seen to establish care in the Advanced Hypertension Clinic.  On 04/17/2021 she underwent a total abdominal hysterectomy with bilateral salpingectomy for benign uterine fibroids. She began antihypertensives this past year (amlodipine). Previously she was on HCTZ for a short time, but her blood pressure dropped as low as 96/66 and she did not feel well.  Lately she has been feeling well.  She has been walking and going to the gym 3 days/week.  She has no exertional chest pain or shortness of breath.  She denies lower extremity edema, orthopnea, or PND.  At home her blood pressures been around 130/85.  She had a couple episodes of her blood pressure getting too low.  When her blood pressure drops she gets headaches.  She recently went on a cruise and struggled with her diet.  Previous antihypertensives: HCTZ - Low blood pressure, cramps   Past Medical History:  Diagnosis Date   Essential hypertension 05/16/2021   HTN (hypertension)    Morbid obesity ([place]) 05/16/2021   Pre-diabetes    Pure hypercholesterolemia 05/16/2021    Past Surgical History:  Procedure Laterality Date   ABDOMINAL HYSTERECTOMY     ANKLE ARTHROSCOPY     WRIST ARTHROPLASTY      Current Medications: Current Meds  Medication Sig   amLODipine (NORVASC) 5 MG tablet Take 5 mg by mouth daily.   Cyanocobalamin (VITAMIN B-12 IJ) Inject 1 Dose as directed once a week.   dicyclomine (BENTYL) 20 MG tablet Take 1 tablet (20 mg total) by mouth 2 (two) times daily.   ELDERBERRY PO Take 1 tablet by mouth in the morning and at bedtime.    Glucosamine-Chondroitin (MOVE FREE PO) Take 1 tablet by mouth every evening.   Multiple Vitamin (MULTIVITAMIN) tablet Take 1 tablet by mouth every evening.   Multiple Vitamins-Minerals (ICAPS AREDS 2 PO) Take 1 tablet by mouth in the morning and at bedtime.   Omega-3 Fatty Acids (FISH OIL) 1000 MG CAPS Take 1 capsule by mouth every evening.   ondansetron (ZOFRAN) 4 MG tablet Take 1 tablet (4 mg total) by mouth every 6 (six) hours.   Probiotic Product (PROBIOTIC-10 PO) Take 1 capsule by mouth every evening.   [DISCONTINUED] rosuvastatin (CRESTOR) 5 MG tablet Take 5 mg by mouth daily.     Allergies:   Nickel   Social History   Socioeconomic History   Marital status: Single    Spouse name: Not on file   Number of children: Not on file   Years of education: Not on file   Highest education level: Not on file  Occupational History   Not on file  Tobacco Use   Smoking status: Never   Smokeless tobacco: Never  Substance and Sexual Activity   Alcohol use: Not Currently   Drug use: Not on file   Sexual activity: Not on file  Other Topics Concern   Not on file  Social History Narrative   Not on file   Social Determinants of Health   Financial Resource Strain: Low Risk  (05/16/2021)   Overall Financial  Resource Strain (CARDIA)    Difficulty of Paying Living Expenses: Not hard at all  Food Insecurity: No Food Insecurity (05/16/2021)   Hunger Vital Sign    Worried About Running Out of Food in the Last Year: Never true    Ran Out of Food in the Last Year: Never true  Transportation Needs: No Transportation Needs (05/16/2021)   PRAPARE - Hydrologist (Medical): No    Lack of Transportation (Non-Medical): No  Physical Activity: Inactive (05/16/2021)   Exercise Vital Sign    Days of Exercise per Week: 0 days    Minutes of Exercise per Session: 0 min  Stress: Not on file  Social Connections: Not on file     Family History: The patient's family history  includes Asthma in her maternal aunt; Breast cancer (age of onset: 48) in her cousin; Breast cancer (age of onset: 29) in her maternal aunt; Diabetes in her father; Hypertension in her brother, father, mother, and sister; Kidney failure in her father; Transient ischemic attack in her father.  ROS:   Please see the history of present illness.    (+) Snoring (+) Shortness of breath All other systems reviewed and are negative.  EKGs/Labs/Other Studies Reviewed:    No prior CV studies available.   EKG:   05/16/2021: Sinus rhythm. Rate 80 bpm. 8/10/223: Sinus rhythm.  Rate 60 bpm.    Recent Labs: 01/05/2022: Hemoglobin 13.7; Platelets 316 03/26/2022: ALT 14; BUN 10; Creatinine, Ser 0.78; Potassium 4.0; Sodium 138   Recent Lipid Panel    Component Value Date/Time   CHOL 187 03/26/2022 0918   TRIG 75 03/26/2022 0918   HDL 45 03/26/2022 0918   CHOLHDL 4.2 03/26/2022 0918   LDLCALC 128 (H) 03/26/2022 0918    Physical Exam:   VS:  BP 120/80 (BP Location: Left Arm, Patient Position: Sitting, Cuff Size: Large)   Pulse 60   Ht '5\' 4"'$  (1.626 m)   Wt 236 lb 4.8 oz (107.2 kg)   BMI 40.56 kg/m  , BMI Body mass index is 40.56 kg/m. GENERAL:  Well appearing HEENT: Pupils equal round and reactive, fundi not visualized, oral mucosa unremarkable NECK:  No jugular venous distention, waveform within normal limits, carotid upstroke brisk and symmetric, no bruits, +thyromegaly LUNGS:  Clear to auscultation bilaterally HEART:  RRR.  PMI not displaced or sustained,S1 and S2 within normal limits, no S3, no S4, no clicks, no rubs, no murmurs ABD:  Flat, positive bowel sounds normal in frequency in pitch, no bruits, no rebound, no guarding, no midline pulsatile mass, no hepatomegaly, no splenomegaly EXT:  2 plus pulses throughout, no edema, no cyanosis no clubbing SKIN:  No rashes no nodules NEURO:  Cranial nerves II through XII grossly intact, motor grossly intact throughout PSYCH:  Cognitively intact,  oriented to person place and time   ASSESSMENT/PLAN:    Essential hypertension Blood pressure is much better controlled.  She is doing well on amlodipine.  Encouraged her to increase exercise to at least 150 minutes weekly.  Pure hypercholesterolemia Her lipids are going in the wrong direction.  Her LDL increased from 80 to 128.  Will increase rosuvastatin to 10 mg.  Check fasting lipids and a CMP in 2 to 3 months.  Work on diet and exercise.  Morbid obesity ([place]) Ms. back has struggled with her weight.  She continues to gain weight.  She is going to work on increasing her diet and exercise.  We will also  prescribe Ozempic as she has a history of prediabetes.  Start with 0.25 mg weekly for 4 weeks then 0.5 mg weekly for 4 weeks then 1 mg weekly.  Disposition:    FU with Brandy Molyneux C. Oval Linsey, MD, [facility] in 6 months.   Medication Adjustments/Labs and Tests Ordered: Current medicines are reviewed at length with the patient today.  Concerns regarding medicines are outlined above.   Orders Placed This Encounter  Procedures   Lipid panel   Comprehensive metabolic panel   AMB Referral to [facility] Pharm-D   EKG 12-Lead    Meds ordered this encounter  Medications   rosuvastatin (CRESTOR) 10 MG tablet    Sig: Take 1 tablet (10 mg total) by mouth daily.    Dispense:  90 tablet    Refill:  3    D/C 5 MG RX, NEW DOSE    Signed, Skeet Latch, MD  03/28/2022 5:25 PM    [facility]

## 2022-03-28 NOTE — Assessment & Plan Note (Signed)
Blood pressure is much better controlled.  She is doing well on amlodipine.  Encouraged her to increase exercise to at least 150 minutes weekly.

## 2022-04-23 ENCOUNTER — Ambulatory Visit: Payer: 59 | Attending: Cardiology | Admitting: Pharmacist

## 2022-04-23 ENCOUNTER — Telehealth: Payer: Self-pay | Admitting: Pharmacist

## 2022-04-23 ENCOUNTER — Encounter: Payer: Self-pay | Admitting: Pharmacist

## 2022-04-23 VITALS — BP 135/87 | HR 80 | Wt 239.6 lb

## 2022-04-23 DIAGNOSIS — I10 Essential (primary) hypertension: Secondary | ICD-10-CM

## 2022-04-23 DIAGNOSIS — E7800 Pure hypercholesterolemia, unspecified: Secondary | ICD-10-CM

## 2022-04-23 NOTE — Progress Notes (Signed)
Patient ID: Brandy Dunn                 DOB: 1970-10-09                    MRN: [id number]      HPI: Brandy Dunn is a 51 y.o. female patient referred to pharmacy clinic by Dr Oval Linsey to initiate weight loss therapy with GLP1-RA. PMH is significant for obesity, HLD, and HTN. Most recent BMI 41.11  Patient presents today to discuss Ozempic. However her last A1c was 5.8 so would likely not qualify. Is concerned regarding side effects. Recently LDL had increased and patient's rosuvastatin was increased. She reports increased muscle cramps in her legs and her chiropractor recommended Co Q 10.  Works Health and safety inspector at [employer].   Current weight management medications: N/A  Previously tried meds: N/A  Current meds that may affect weight:  Baseline weight/BMI: 41.1  Insurance payor: [place]: No results found for: "HGBA1C"  Wt Readings from Last 1 Encounters:  03/28/22 236 lb 4.8 oz (107.2 kg)    BP Readings from Last 1 Encounters:  03/28/22 120/80   Pulse Readings from Last 1 Encounters:  03/28/22 60       Component Value Date/Time   CHOL 187 03/26/2022 0918   TRIG 75 03/26/2022 0918   HDL 45 03/26/2022 0918   CHOLHDL 4.2 03/26/2022 0918   LDLCALC 128 (H) 03/26/2022 0918    Past Medical History:  Diagnosis Date   Essential hypertension 05/16/2021   HTN (hypertension)    Morbid obesity ([place]) 05/16/2021   Pre-diabetes    Pure hypercholesterolemia 05/16/2021    Current Outpatient Medications on File Prior to Visit  Medication Sig Dispense Refill   amLODipine (NORVASC) 5 MG tablet Take 5 mg by mouth daily.     Cyanocobalamin (VITAMIN B-12 IJ) Inject 1 Dose as directed once a week.     dicyclomine (BENTYL) 20 MG tablet Take 1 tablet (20 mg total) by mouth 2 (two) times daily. 20 tablet 0   ELDERBERRY PO Take 1 tablet by mouth in the morning and at bedtime.     Glucosamine-Chondroitin (MOVE FREE PO) Take 1 tablet by mouth every  evening.     Multiple Vitamin (MULTIVITAMIN) tablet Take 1 tablet by mouth every evening.     Multiple Vitamins-Minerals (ICAPS AREDS 2 PO) Take 1 tablet by mouth in the morning and at bedtime.     Omega-3 Fatty Acids (FISH OIL) 1000 MG CAPS Take 1 capsule by mouth every evening.     ondansetron (ZOFRAN) 4 MG tablet Take 1 tablet (4 mg total) by mouth every 6 (six) hours. 12 tablet 0   Probiotic Product (PROBIOTIC-10 PO) Take 1 capsule by mouth every evening.     rosuvastatin (CRESTOR) 10 MG tablet Take 1 tablet (10 mg total) by mouth daily. 90 tablet 3   No current facility-administered medications on file prior to visit.    Allergies  Allergen Reactions   Nickel Rash    Skin blisters     Assessment/Plan:  1. Weight loss - Patient referred to start [place] however she is not diabetic. Discussed that Mancel Parsons was the same medication just with different doses.  Using demo pen, educated patient on mechanism of action, storage, site selection, and administration.  Discussed possible side effects in detail. Confirmed patient not pregnant no personal or family history of medullary thyroid carcinoma (MTC) or Multiple Endocrine Neoplasia  syndrome type 2 (MEN 2).   Advised patient on common side effects including nausea, diarrhea, dyspepsia, decreased appetite, and fatigue. Counseled patient on reducing meal size and how to titrate medication to minimize side effects. Counseled patient to call if intolerable side effects or if experiencing dehydration, abdominal pain, or dizziness. Patient will adhere to dietary modifications and will target at least 150 minutes of moderate intensity exercise weekly.   Will complete prior authorization and contact patient if approved. Advised it was difficult to find at pharmacies however.  Karren Cobble, PharmD, BCACP, [place], [place], [place], [place], [address] Phone: [phone], Fax: [phone]

## 2022-04-23 NOTE — Telephone Encounter (Signed)
PA for [facility] submitted.  Key: Thad Ranger

## 2022-04-23 NOTE — Patient Instructions (Addendum)
It was nice meeting you today  We would to start you on a new medication today called Mancel Parsons  You will inject one pen once a week  I will do the prior authorization for you and contact you when it is approved. Then we will try to see where it is available  Please call with any questions  Karren Cobble, PharmD, [place], [place], [place], [place], [place], [address] Phone: [phone], Fax: [phone]

## 2022-07-08 LAB — LIPID PANEL
Chol/HDL Ratio: 2.9 ratio (ref 0.0–4.4)
Cholesterol, Total: 136 mg/dL (ref 100–199)
HDL: 47 mg/dL (ref >39)
LDL Chol Calc (NIH): 78 mg/dL (ref 0–99)
Triglycerides: 50 mg/dL (ref 0–149)
VLDL Cholesterol Cal: 11 mg/dL (ref 5–40)

## 2022-07-08 LAB — COMPREHENSIVE METABOLIC PANEL
ALT: 13 IU/L (ref 0–32)
AST: 17 IU/L (ref 0–40)
Albumin/Globulin Ratio: 1.4 (ref 1.2–2.2)
Albumin: 4 g/dL (ref 3.8–4.9)
Alkaline Phosphatase: 90 IU/L (ref 44–121)
BUN/Creatinine Ratio: 17 (ref 9–23)
BUN: 11 mg/dL (ref 6–24)
Bilirubin Total: 0.5 mg/dL (ref 0.0–1.2)
CO2: 22 mmol/L (ref 20–29)
Calcium: 9.2 mg/dL (ref 8.7–10.2)
Chloride: 100 mmol/L (ref 96–106)
Creatinine, Ser: 0.66 mg/dL (ref 0.57–1.00)
Globulin, Total: 2.8 g/dL (ref 1.5–4.5)
Glucose: 98 mg/dL (ref 70–99)
Potassium: 4.4 mmol/L (ref 3.5–5.2)
Sodium: 136 mmol/L (ref 134–144)
Total Protein: 6.8 g/dL (ref 6.0–8.5)
eGFR: 105 mL/min/{1.73_m2} (ref >59)

## 2022-07-09 ENCOUNTER — Ambulatory Visit (INDEPENDENT_AMBULATORY_CARE_PROVIDER_SITE_OTHER): Payer: 59 | Admitting: Cardiovascular Disease

## 2022-07-09 ENCOUNTER — Encounter (HOSPITAL_BASED_OUTPATIENT_CLINIC_OR_DEPARTMENT_OTHER): Payer: Self-pay | Admitting: Cardiovascular Disease

## 2022-07-09 VITALS — BP 126/78 | HR 78 | Ht 64.0 in | Wt 241.7 lb

## 2022-07-09 DIAGNOSIS — E7800 Pure hypercholesterolemia, unspecified: Secondary | ICD-10-CM

## 2022-07-09 DIAGNOSIS — I10 Essential (primary) hypertension: Secondary | ICD-10-CM | POA: Diagnosis not present

## 2022-07-09 MED ORDER — ROSUVASTATIN CALCIUM 5 MG PO TABS: 5.0000 mg | ORAL_TABLET | Freq: Every day | ORAL | 3 refills | Status: DC

## 2022-07-09 NOTE — Assessment & Plan Note (Signed)
She continues to struggle with her weight.  She is going to work on increasing her exercise.  She is also working on her diet.  She had Wegovy ordered but unclear whether it was authorized by her insurance.  We will check on this.

## 2022-07-09 NOTE — Assessment & Plan Note (Signed)
Blood pressure has been well-controlled at home.  Continue amlodipine.

## 2022-07-09 NOTE — Assessment & Plan Note (Signed)
She didn't tolerate the '10mg'$  of rosuvastatin.  She reduced it to '5mg'$  and is tolerating it well.  Lipids are well-controlled.  She is going to work on increasing her exercise and improving her diet.

## 2022-07-09 NOTE — Progress Notes (Signed)
Cardiology Office Note:    Date:  07/09/2022   ID:  DELANO SCARDINO, DOB 28-Nov-1970, MRN [id number]  PCP:  [facility]  Cardiologist:  None  Nephrologist:  Referring MD: [facility]   CC: Hypertension  History of Present Illness:    Brandy Dunn is a 51 y.o. female with a hx of hypertension, hyperlipidemia, and pre-diabetes, here for follow up. She was first seen to establish care in the Advanced Hypertension Clinic on 04/2021. She was previously on HCTZ so she switched to. On the last visit she started on Ozempic and her statin was increased.   Today, she reports that her pressures been well controlled at home.  One time she had a headache that was high but when she checked it was 117/75.  She reports that when taking 10 mg of rosuvastatin she experienced cramps, so she has been cutting her rosuvastatin in half. She plans to take a visit with a OB/GYN due to complains of abdominal pain after eating certain foods.  This started after her hysterectomy.  She is unable to tolerate popcorn.  In the last month she has not been exercising often, but plans to begin exercising again on her bike. She reports that she has not started her Ozempic since the pharmacy has not contacted her that her prescription was ready for pick up.She denies any palpitations, chest pain, shortness of breath, or peripheral edema. No lightheadedness, headaches, syncope, orthopnea, or PND.  Previous antihypertensives: HCTZ - Low blood pressure, cramps  Past Medical History:  Diagnosis Date   Essential hypertension 05/16/2021   HTN (hypertension)    Morbid obesity ([place]) 05/16/2021   Pre-diabetes    Pure hypercholesterolemia 05/16/2021    Past Surgical History:  Procedure Laterality Date   ABDOMINAL HYSTERECTOMY     ANKLE ARTHROSCOPY     WRIST ARTHROPLASTY      Current Medications: Current Meds  Medication Sig   amLODipine (NORVASC) 5 MG tablet Take 5 mg by mouth daily.    Glucosamine-Chondroitin (MOVE FREE PO) Take 1 tablet by mouth every evening.   Multiple Vitamin (MULTIVITAMIN) tablet Take 1 tablet by mouth every evening.   Multiple Vitamins-Minerals (ICAPS AREDS 2 PO) Take 1 tablet by mouth in the morning and at bedtime.   Omega-3 Fatty Acids (FISH OIL) 1000 MG CAPS Take 1 capsule by mouth every evening.   Probiotic Product (PROBIOTIC-10 PO) Take 1 capsule by mouth every evening.   [DISCONTINUED] rosuvastatin (CRESTOR) 10 MG tablet Take 1 tablet (10 mg total) by mouth daily.     Allergies:   Nickel   Social History   Socioeconomic History   Marital status: Single    Spouse name: Not on file   Number of children: Not on file   Years of education: Not on file   Highest education level: Not on file  Occupational History   Not on file  Tobacco Use   Smoking status: Never   Smokeless tobacco: Never  Substance and Sexual Activity   Alcohol use: Not Currently   Drug use: Not on file   Sexual activity: Not on file  Other Topics Concern   Not on file  Social History Narrative   Not on file   Social Determinants of Health   Financial Resource Strain: Low Risk  (05/16/2021)   Overall Financial Resource Strain (CARDIA)    Difficulty of Paying Living Expenses: Not hard at all  Food Insecurity: No Food Insecurity (05/16/2021)  Hunger Vital Sign    Worried About Running Out of Food in the Last Year: Never true    Ran Out of Food in the Last Year: Never true  Transportation Needs: No Transportation Needs (05/16/2021)   PRAPARE - Hydrologist (Medical): No    Lack of Transportation (Non-Medical): No  Physical Activity: Inactive (05/16/2021)   Exercise Vital Sign    Days of Exercise per Week: 0 days    Minutes of Exercise per Session: 0 min  Stress: Not on file  Social Connections: Not on file     Family History: The patient's family history includes Asthma in her maternal aunt; Breast cancer (age of onset: 46) in her  cousin; Breast cancer (age of onset: 61) in her maternal aunt; Diabetes in her father; Hypertension in her brother, father, mother, and sister; Kidney failure in her father; Transient ischemic attack in her father.  ROS:   Please see the history of present illness.    (+) Cramps All other systems reviewed and are negative.  EKGs/Labs/Other Studies Reviewed:    No prior CV studies available.   EKG:  EKG is personally reviewed. 05/16/2021: Sinus rhythm. Rate 80 bpm. 8/10/223: Sinus rhythm.  Rate 60 bpm.   07/09/2022: EKG was not ordered.  Recent Labs: 01/05/2022: Hemoglobin 13.7; Platelets 316 07/08/2022: ALT 13; BUN 11; Creatinine, Ser 0.66; Potassium 4.4; Sodium 136   Recent Lipid Panel    Component Value Date/Time   CHOL 136 07/08/2022 0842   TRIG 50 07/08/2022 0842   HDL 47 07/08/2022 0842   CHOLHDL 2.9 07/08/2022 0842   LDLCALC 78 07/08/2022 0842    Physical Exam:   VS:  BP 126/78   Pulse 78   Ht '5\' 4"'$  (1.626 m)   Wt 241 lb 11.2 oz (109.6 kg)   BMI 41.49 kg/m  , BMI Body mass index is 41.49 kg/m. GENERAL:  Well appearing HEENT: Pupils equal round and reactive, fundi not visualized, oral mucosa unremarkable NECK:  No jugular venous distention, waveform within normal limits, carotid upstroke brisk and symmetric, no bruits, +thyromegaly LUNGS:  Clear to auscultation bilaterally HEART:  RRR.  PMI not displaced or sustained,S1 and S2 within normal limits, no S3, no S4, no clicks, no rubs, no murmurs ABD:  Flat, positive bowel sounds normal in frequency in pitch, no bruits, no rebound, no guarding, no midline pulsatile mass, no hepatomegaly, no splenomegaly EXT:  2 plus pulses throughout, no edema, no cyanosis no clubbing SKIN:  No rashes no nodules NEURO:  Cranial nerves II through XII grossly intact, motor grossly intact throughout PSYCH:  Cognitively intact, oriented to person place and time   ASSESSMENT/PLAN:    Essential hypertension Blood pressure has been  well-controlled at home.  Continue amlodipine.   Pure hypercholesterolemia She didn't tolerate the '10mg'$  of rosuvastatin.  She reduced it to '5mg'$  and is tolerating it well.  Lipids are well-controlled.  She is going to work on increasing her exercise and improving her diet.   Morbid obesity ([place]) She continues to struggle with her weight.  She is going to work on increasing her exercise.  She is also working on her diet.  She had Wegovy ordered but unclear whether it was authorized by her insurance.  We will check on this.   Disposition:    FU with Kabao Leite C. Oval Linsey, MD, [facility] as needed.   Medication Adjustments/Labs and Tests Ordered: Current medicines are reviewed at length with the patient today.  Concerns regarding medicines are outlined above.   No orders of the defined types were placed in this encounter.  Meds ordered this encounter  Medications   rosuvastatin (CRESTOR) 5 MG tablet    Sig: Take 1 tablet (5 mg total) by mouth daily.    Dispense:  90 tablet    Refill:  3    D/C 10 MG RX, NEW DOSE    I,Rachel Rivera,acting as a scribe for Skeet Latch, MD.,have documented all relevant documentation on the behalf of Skeet Latch, MD,as directed by  Skeet Latch, MD while in the presence of Skeet Latch, MD.  I, [place] Oval Linsey, MD have reviewed all documentation for this visit.  The documentation of the exam, diagnosis, procedures, and orders on 07/09/2022 are all accurate and complete.    Signed, Skeet Latch, MD  07/09/2022 8:52 AM    [place]

## 2022-07-09 NOTE — Patient Instructions (Addendum)
Medication Instructions:  RX FOR ROSUVASTATIN 5 MG HAS BEEN SENT TO PHARMACY   WILL REACH OUT TO [facility] D TO FOLLOW UP ON WEGOVY   Labwork: NONE  Testing/Procedures: NONE  Follow-Up: AS NEEDED

## 2022-07-10 NOTE — Telephone Encounter (Signed)
Response from plan:  Why was my request denied? This request was denied because you did not meet the following clinical requirements: The requested medication and/or diagnosis are not a covered benefit and excluded from coverage in accordance with the terms and conditions of your plan benefit. Therefore, the request has been administratively denied. The requested medication and/or diagnosis are not a covered benefit and are excluded from coverage in accordance with the terms and conditions of your plan benefit. Therefore, this request has been administratively denied

## 2022-09-04 ENCOUNTER — Other Ambulatory Visit: Payer: Self-pay | Admitting: Family Medicine

## 2022-09-04 DIAGNOSIS — Z1231 Encounter for screening mammogram for malignant neoplasm of breast: Secondary | ICD-10-CM

## 2022-10-14 ENCOUNTER — Other Ambulatory Visit: Payer: Self-pay | Admitting: Internal Medicine

## 2022-10-14 DIAGNOSIS — R1013 Epigastric pain: Secondary | ICD-10-CM

## 2022-10-15 ENCOUNTER — Ambulatory Visit
Admission: RE | Admit: 2022-10-15 | Discharge: 2022-10-15 | Disposition: A | Discharge status: Home / Self Care | Payer: 59 | Source: Ambulatory Visit | Attending: Internal Medicine | Admitting: Internal Medicine

## 2022-10-15 DIAGNOSIS — R1013 Epigastric pain: Secondary | ICD-10-CM

## 2022-10-24 ENCOUNTER — Ambulatory Visit
Admission: RE | Admit: 2022-10-24 | Discharge: 2022-10-24 | Disposition: A | Discharge status: Home / Self Care | Payer: 59 | Source: Ambulatory Visit | Attending: Family Medicine | Admitting: Family Medicine

## 2022-10-24 DIAGNOSIS — Z1231 Encounter for screening mammogram for malignant neoplasm of breast: Secondary | ICD-10-CM

## 2023-04-25 ENCOUNTER — Encounter (INDEPENDENT_AMBULATORY_CARE_PROVIDER_SITE_OTHER): Payer: Self-pay | Admitting: Otolaryngology

## 2023-04-25 ENCOUNTER — Ambulatory Visit (INDEPENDENT_AMBULATORY_CARE_PROVIDER_SITE_OTHER): Payer: 59 | Admitting: Otolaryngology

## 2023-04-25 VITALS — BP 154/78 | HR 82 | Ht 64.0 in | Wt 249.0 lb

## 2023-04-25 DIAGNOSIS — R0981 Nasal congestion: Secondary | ICD-10-CM

## 2023-04-25 DIAGNOSIS — M26622 Arthralgia of left temporomandibular joint: Secondary | ICD-10-CM | POA: Diagnosis not present

## 2023-04-25 DIAGNOSIS — H6993 Unspecified Eustachian tube disorder, bilateral: Secondary | ICD-10-CM

## 2023-04-25 DIAGNOSIS — H9202 Otalgia, left ear: Secondary | ICD-10-CM

## 2023-04-25 DIAGNOSIS — H9312 Tinnitus, left ear: Secondary | ICD-10-CM | POA: Diagnosis not present

## 2023-04-25 DIAGNOSIS — J343 Hypertrophy of nasal turbinates: Secondary | ICD-10-CM

## 2023-04-25 DIAGNOSIS — J3089 Other allergic rhinitis: Secondary | ICD-10-CM

## 2023-04-25 MED ORDER — FLUTICASONE PROPIONATE 50 MCG/ACT NA SUSP: 2.0000 | Freq: Every day | NASAL | 6 refills | Status: AC

## 2023-04-25 MED ORDER — DESLORATADINE 5 MG PO TABS: 5.0000 mg | ORAL_TABLET | Freq: Every day | ORAL | 3 refills | Status: DC

## 2023-04-25 NOTE — Patient Instructions (Addendum)
-take Clarinex and use Flonase  - we may need to do hearing test for you if covered by insurance - return 2 mo  TMJ (Temporomandibular Joint Syndrome) The temporomandibular (tem-puh-roe-man-DIB-u-lur) joint (TMJ) acts like a sliding hinge, connecting your jawbone to your skull. You have one joint on each side of your jaw. TMJ disorders -- a type of temporomandibular disorder or TMD -- can cause pain in your jaw joint and in the muscles that control jaw movement.  The exact cause of a person's TMJ disorder is often difficult to determine. Your pain may be due to a combination of factors, such as genetics, arthritis or jaw injury. Some people who have jaw pain also tend to clench or grind their teeth (bruxism), although many people habitually clench or grind their teeth and never develop TMJ disorders.  In most cases, the pain and discomfort associated with TMJ disorders is temporary and can be relieved with self-managed care or nonsurgical treatments. This includes stress reduction, softer diet when the pain is present, anti-inflammatory pain medications such as Motrin and warm compresses.     WHAT IS TINNITUS?  Ringing in your ears (also known as tinnitus) can be caused by a number of conditions including age-related hearing loss, which is by far the most common cause of tinnitus.   Tinnitus can be heard in one or both sides of the head. The noises can sound like they are either from within or  outside the head. Tinnitus sounds can include ringing, roaring, buzzing, clicking, beating, whooshing, whistling,  humming, or other noises. The person may 'hear' their tinnitus all the time, or only in certain situations. Tinnitus can hurt a person's quality of life. Patients may experience symptoms at different levels of severity. Common  patient complaints include difficulty sleeping, struggling to understand other's speech, depression, and problems  focusing. These experiences could lead to problems  with both work and family life.  WHAT CAUSES TINNITUS? ARE THERE RISK FACTORS?  There are two types of tinnitus: Primary and Secondary. Primary tinnitus has an unknown cause. It may or may  not be linked with hearing loss. Secondary tinnitus has a specific known cause. It may be such things like impacted earwax, or diseases or pressure behind the eardrum. Secondary tinnitus can also be related to Meniere's disease or  ear nerve conditions. Tinnitus can be caused by more unusual or serious conditions. Some of these rare conditions  include tumors, heart problems, or blood vessel problems.  Tinnitus can be seen at any age, in males or females, and in all ethnic groups. Tinnitus occurs more frequently in  males, the elderly and non-Hispanic whites. There is a higher rate of tinnitus among [employer]. Tinnitus is  also more likely to occur in people who are overweight, obese, or who have high blood pressure. Other risk factors  include diabetes, high cholesterol, or anxiety disorder. Tinnitus is believed to be linked to long-term noise exposure.  Exposure to noise, such as firearms or loud music, is also a risk factor.  WHAT CAN YOU DO?  You should seek medical care after you notice symptoms which may help avoid misdiagnosis or delayed diagnosis.  Tinnitus can be very upsetting, and it can even be associated with depression and anxiety. Tell your doctor if you are  having a strong emotional response to your tinnitus. Tinnitus patients commonly have trouble sleeping (insomnia).  Lack of sleep can reduce the ability to pay attention. It can also lead to anger, frustration, and other  negative emotions. Some patients develop a fear of being in noisy places. It is important to tell your doctor if symptoms are  affecting your daily life. Avoiding totally quiet environments by using fans, humidifiers, sound machines, etc. Trial of melatonin (3mg ) at bedtime might help with insomnia.   See  information about Eustachian Tube Dysfunction below:    Overview The eustachian (say "you-STAY-shee-un") tubes connect the middle ear on each side to the back of the throat. They keep air pressure stable in the ears. If your eustachian tubes become blocked, the air pressure in your ears changes. A quick change in air pressure can cause eustachian tubes to close up. This might happen when an airplane changes altitude or when a scuba diver goes up or down underwater. And a cold can make the tubes swell and block the fluid in the middle ear from draining out. That can cause pain.  Eustachian tube problems often clear up on their own or after treating the cause of the blockage. If your tubes continue to be blocked, you may need surgery.  Follow-up care is a key part of your treatment and safety. Be sure to make and go to all appointments, and call your doctor or nurse advice line (811 in most provinces and territories) if you are having problems. It's also a good idea to know your test results and keep a list of the medicines you take.  How can you care for yourself at home? Try a simple exercise to help open blocked tubes. Close your mouth, hold your nose, and gently blow as if you are blowing your nose. Yawning and chewing gum also may help. You may hear or feel a "pop" when the tubes open. To ease ear pain, apply a warm face cloth or a heating pad set on low. There may be some drainage from the ear when the heat melts earwax. Put a cloth between the heat source and your skin. If your doctor prescribed antibiotics, take them as directed. Do not stop taking them just because you feel better. You need to take the full course of antibiotics. Be safe with medicines. Depending on the cause of the problem, your doctor may recommend over-the-counter medicine. For example, adults may try decongestants for cold symptoms or nasal spray steroids for allergies. Follow the instructions carefully.

## 2023-04-25 NOTE — Progress Notes (Signed)
ENT CONSULT:  Reason for Consult: left sided tinnitus for 30 yrs    HPI: Brandy Dunn is an 52 y.o. female veteran who is here for evaluation of chronic left sided tinnitus.  She reports intermittent bilateral ear aches and ringing in the left ear described as high-pitch sound. No hx of ear infections, no hx of ear surgeries. She denies hx of muffled hearing, but unable to pop her ears. Last time she flew she had bilateral ear discomfort. She denies hx of environmental allergies, not on nasal sprays or allergy pills. No CPAP use or smoking. The ringing in her left ear is intermittent, high-pitch sound. She wakes up with it. She [facility], but does not know what the results showed, and does not have a copy.  She reports significant noise exposure while in [employer].  No history of head trauma.  No vertigo or dizziness.   Records Reviewed:  Note by Dr Jacqulyn Bath, PCP 02/07/23 Had cerumen lavage done when seen ad dx with left tinnitus     "She has tinnitus in left ear for 2 years initially it was intermittent however since last 6 months she has persistent ringing sensation in her left ear. She denies any other symptoms such as headache, blurry vision, lightheadedness, dizziness, vertigo, ear pain, fever, chills, history of recurrent ear infection, bleeding, discharge from the ear, presyncope/syncope episode.  Denies hearing loss either, no recent upper respiratory symptoms/infection.  She had a hearing test done at [place] about a month ago however she has never received the results yet.  She was seen by Dr. Ezzard Standing in the past and would like to see him again.  Impacted wax noted in right ear. She used over-the-counter Debrox at home that seems to be not helpful. She denies any hearing loss or feels like her ears are clogged up.  Hypertension: Her BP is elevated today. She forgot to take amlodipine last night. Overall she has been compliant with it.  She  checks BP at home and runs within normal limit per patient. "   Past Medical History:  Diagnosis Date   Essential hypertension 05/16/2021   HTN (hypertension)    Morbid obesity (HCC) 05/16/2021   Pre-diabetes    Pure hypercholesterolemia 05/16/2021    Past Surgical History:  Procedure Laterality Date   ABDOMINAL HYSTERECTOMY     ANKLE ARTHROSCOPY     WRIST ARTHROPLASTY      Family History  Problem Relation Age of Onset   Hypertension Mother    Hypertension Father    Diabetes Father    Kidney failure Father    Transient ischemic attack Father    Hypertension Sister    Hypertension Brother    Asthma Maternal Aunt        breast   Breast cancer Maternal Aunt 69   Breast cancer Cousin 39    Social History:  reports that she has never smoked. She has never used smokeless tobacco. She reports that she does not currently use alcohol. No history on file for drug use.  Allergies:  Allergies  Allergen Reactions   Nickel Rash    Skin blisters    Medications: I have reviewed the patient's current medications.  The PMH, PSH, Medications, Allergies, and SH were reviewed and updated.  ROS: Constitutional: Negative for fever, weight loss and weight gain. Cardiovascular: Negative for chest pain and dyspnea on exertion. Respiratory: Is not experiencing shortness of breath at rest. Gastrointestinal: Negative  for nausea and vomiting. Neurological: Negative for headaches. Psychiatric: The patient is not nervous/anxious  Blood pressure (!) 154/78, pulse 82, height 5\' 4"  (1.626 m), weight 249 lb (112.9 kg), SpO2 98%.  PHYSICAL EXAM:  Exam: General: Well-developed, well-nourished Respiratory Respiratory effort: Equal inspiration and expiration without stridor Cardiovascular Peripheral Vascular: Warm extremities with equal color/perfusion Eyes: No nystagmus with equal extraocular motion bilaterally Neuro/Psych/Balance: Patient oriented to person, place, and time; Appropriate mood  and affect; Gait is intact with no imbalance; Cranial nerves I-XII are intact Head and Face Inspection: Normocephalic and atraumatic without mass or lesion Palpation: Facial skeleton intact without bony stepoffs Salivary Glands: No mass or tenderness Facial Strength: Facial motility symmetric and full bilaterally ENT Pinna: External ear intact and fully developed External canal: Canal is patent with intact skin Tympanic Membrane: Clear and mobile External Nose: No scar or anatomic deformity Internal Nose: Septum intact and midline. No polyps ITH and mucosal edema on anterior rhinoscopy.  Lips, Teeth, and gums: Mucosa and teeth intact and viable TMJ: Mild tenderness to palpation with full mobility  Oral cavity/oropharynx: No erythema or exudate, no lesions present Neck Neck and Trachea: Midline trachea without mass or lesion  Procedure: none  Studies Reviewed:none  Assessment/Plan: Encounter Diagnoses  Name Primary?   Tinnitus of left ear Yes   Left ear pain    Arthralgia of left temporomandibular joint    Dysfunction of both eustachian tubes    Nasal congestion    Hypertrophy of both inferior nasal turbinates    Environmental and seasonal allergies    52 year old female veteran who is here for chronic persistent left-sided tinnitus and intermittent ear discomfort on both sides worse on the left, reports recent hearing exam done for the [employer] physicians, but unfortunately we do not have results available to review.  She denies history of allergies such as nasal congestion or allergic rhinitis itchy eyes watery eyes.  Feels that the tinnitus is always present in the morning, described as high-pitched noise.  History of significant noise exposure while in the [employer].  Her ear exam today was unremarkable without evidence of ear infection or fluid in the middle ear.  She was somewhat tender at the TMJ on the left side.  Oropharynx was clear.  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  Anterior rhinoscopy also showed mucosal edema and inferior turban hypertrophy suggestive of chronic nasal congestion which could certainly cause ETD.  I discussed with the patient that in order to assess her complaint we will require hearing test, and she would prefer to get test result / her recent audiogram because she is still trying to apply for insurance coverage.  We will try to obtain records.  Will start her on Flonase and Clarinex to treat nasal congestion and ETD.  She will return for symptom check and repeat exam.  -take Clarinex and use Flonase  - Repeat Audio if unable to obtain a copy of a recent hearing test  - return 2 mo -Nasal endoscopy when she returns if evidence of unilateral eustachian tube dysfunction on tympanogram and to evaluate for nasal congestion and environmental allergies  Thank you for allowing me to participate in the care of this patient. Please do not hesitate to contact me with any questions or concerns.   Ashok Croon, MD Otolaryngology [facility] Phone: [phone] Fax: [phone]    04/25/2023, 12:37 PM

## 2023-06-17 ENCOUNTER — Other Ambulatory Visit (HOSPITAL_BASED_OUTPATIENT_CLINIC_OR_DEPARTMENT_OTHER): Payer: Self-pay | Admitting: Cardiovascular Disease

## 2023-06-25 ENCOUNTER — Ambulatory Visit (INDEPENDENT_AMBULATORY_CARE_PROVIDER_SITE_OTHER): Payer: 59 | Admitting: Otolaryngology

## 2023-09-03 NOTE — Progress Notes (Signed)
 Subjective Patient ID: Brandy Dunn is a 53 y.o. female.  Chief Complaint  Patient presents with  . Shoulder Injury    Patient here for right shoulder pain and hip pain from slipping on ice last night while heading to her truck; Patient may have pulled muscle while attempting to prevent falling; Patient also c/o Headache since last night    The following information was reviewed by members of the visit team: Patient Active Problem List  Diagnosis  . Arthritis  . Hyperlipidemia  . Hypertension    Current Outpatient Medications  Medication Instructions  . amLODIPine (NORVASC) 5 mg tablet 1 tablet, Daily  . glucosamine-chondroitin (CIDAFLEX) 500-400 mg tab per tablet 1 tablet, Nightly  . rosuvastatin  (CRESTOR ) 10 mg tablet 0.5 tablets, Daily   No Known Allergies    Shoulder Injury   Patient is a 53 yo female with known Hypertension, Hyperlipidemia and known right shoulder spasms who comes to the [facility] today with c/o right upper back pain since last night.  She was walking in the parking lot of her Condo building that has not had the ice and snow removed .  She was trying to get to her truck and she slipped on ice and she tried not to fall and braced her self. She twisted her right upper back area. She did not fall.  She did not take anything for the pain. She says she had just returned to work this week after being off on vacation x 3 weeks so her right shoulder was not bothering her. Also she did not want to take anything to mask her symptoms   Review of Systems  Musculoskeletal:  Positive for arthralgias. Negative for gait problem, neck pain and neck stiffness.       Right upper back/shoulder pain  Skin:  Negative for rash and wound.  Neurological:  Negative for dizziness and light-headedness.  All other systems reviewed and are negative.   ObjectiveBlood pressure 122/80, pulse 102, temperature 97.4 F (36.3 C), resp. rate 20, height 1.626 m (5' 4), weight 123 kg (272  lb), SpO2 98%.  Physical Exam Vitals and nursing note reviewed.  Constitutional:      General: She is not in acute distress.    Appearance: Normal appearance.  Cardiovascular:     Rate and Rhythm: Normal rate and regular rhythm.  Pulmonary:     Effort: Pulmonary effort is normal.     Breath sounds: Normal breath sounds.  Musculoskeletal:     Right shoulder: Tenderness present. No swelling, bony tenderness or crepitus. Normal range of motion. Normal strength. Normal pulse.       Arms:     Cervical back: Normal and neck supple. No swelling, spasms, tenderness or bony tenderness. No pain with movement. Normal range of motion.     Thoracic back: No swelling, spasms or tenderness.       Back:     Comments: There is no tenderness over the anterior shoulder AC joint or over the infraspinatus or supraspinatus muscles No impingement on exam Normal adduction and abduction of the shoulder Strength is 5/5 b/l  There is acute TTP over the upper paraspinal muscles With spasm  Skin:    General: Skin is warm and dry.  Neurological:     Mental Status: She is alert and oriented to person, place, and time.     Gait: Gait normal.  Psychiatric:        Behavior: Behavior normal.     Assessment/Plan Diagnoses and  all orders for this visit:  Muscle strain of right upper back, initial encounter -     diclofenac (VOLTAREN) 75 mg EC tablet; Take 1 tablet (75 mg total) by mouth 2 (two) times a day.  Advised to take it easy over the next few days until her discomfort improves I did send in a Rx for Diclofenac 75 mg to take take BID and she can also supplement ES Tylenol 1000 mg every 6 hrs if needed She is to work on stretches given for the upper back and shoulder Apply heat to the shoulder/back 2-3 times a day She already sees the [place] as her PCP and has been seeing them for Right upper shoulder spasms and has Flexeril RX at home so advised to restart on that as well RTC as needed Electronically  signed: Inocente Fairly, PA-C 09/03/2023  8:45 AM

## 2023-11-21 ENCOUNTER — Other Ambulatory Visit: Payer: Self-pay | Admitting: Internal Medicine

## 2023-11-21 DIAGNOSIS — Z1231 Encounter for screening mammogram for malignant neoplasm of breast: Secondary | ICD-10-CM

## 2023-11-27 ENCOUNTER — Ambulatory Visit
Admission: RE | Admit: 2023-11-27 | Discharge: 2023-11-27 | Disposition: A | Discharge status: Home / Self Care | Source: Ambulatory Visit | Attending: Internal Medicine

## 2023-11-27 DIAGNOSIS — Z1231 Encounter for screening mammogram for malignant neoplasm of breast: Secondary | ICD-10-CM

## 2023-12-02 ENCOUNTER — Other Ambulatory Visit: Payer: Self-pay | Admitting: Internal Medicine

## 2023-12-02 DIAGNOSIS — R928 Other abnormal and inconclusive findings on diagnostic imaging of breast: Secondary | ICD-10-CM

## 2023-12-15 ENCOUNTER — Encounter

## 2023-12-15 ENCOUNTER — Ambulatory Visit
Admission: RE | Admit: 2023-12-15 | Discharge: 2023-12-15 | Disposition: A | Discharge status: Home / Self Care | Source: Ambulatory Visit | Attending: Internal Medicine

## 2023-12-15 ENCOUNTER — Other Ambulatory Visit

## 2023-12-15 DIAGNOSIS — R928 Other abnormal and inconclusive findings on diagnostic imaging of breast: Secondary | ICD-10-CM

## 2024-01-05 IMAGING — MG MM DIGITAL SCREENING BILAT W/ TOMO AND CAD
6 of 11 series · 6 of 31 positions shown · non-contrast
Comparison: Previous exam(s).

CLINICAL DATA: Screening.

EXAM:
DIGITAL SCREENING BILATERAL MAMMOGRAM WITH TOMOSYNTHESIS AND CAD
TECHNIQUE: Bilateral screening digital craniocaudal and mediolateral oblique
mammograms were obtained. Bilateral screening digital breast
tomosynthesis was performed. The images were evaluated with
computer-aided detection.

[L MLO synth-2D (1 of 2)]
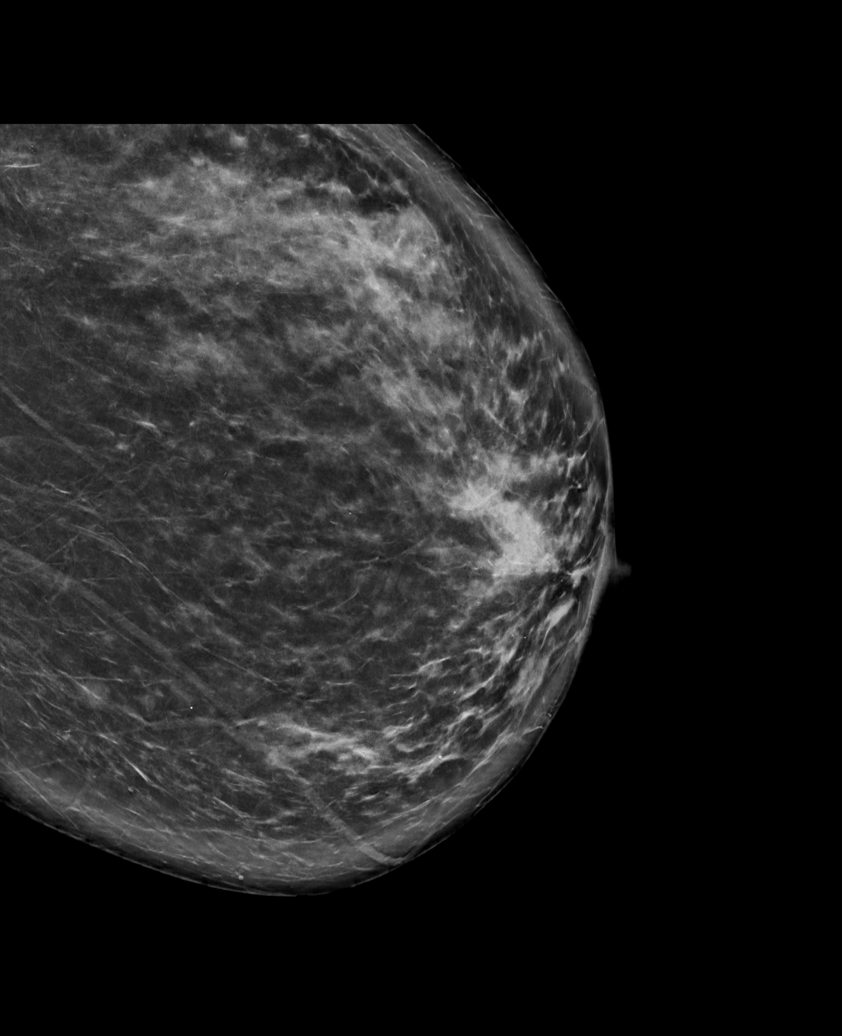

[L MLO synth-2D (2 of 2)]
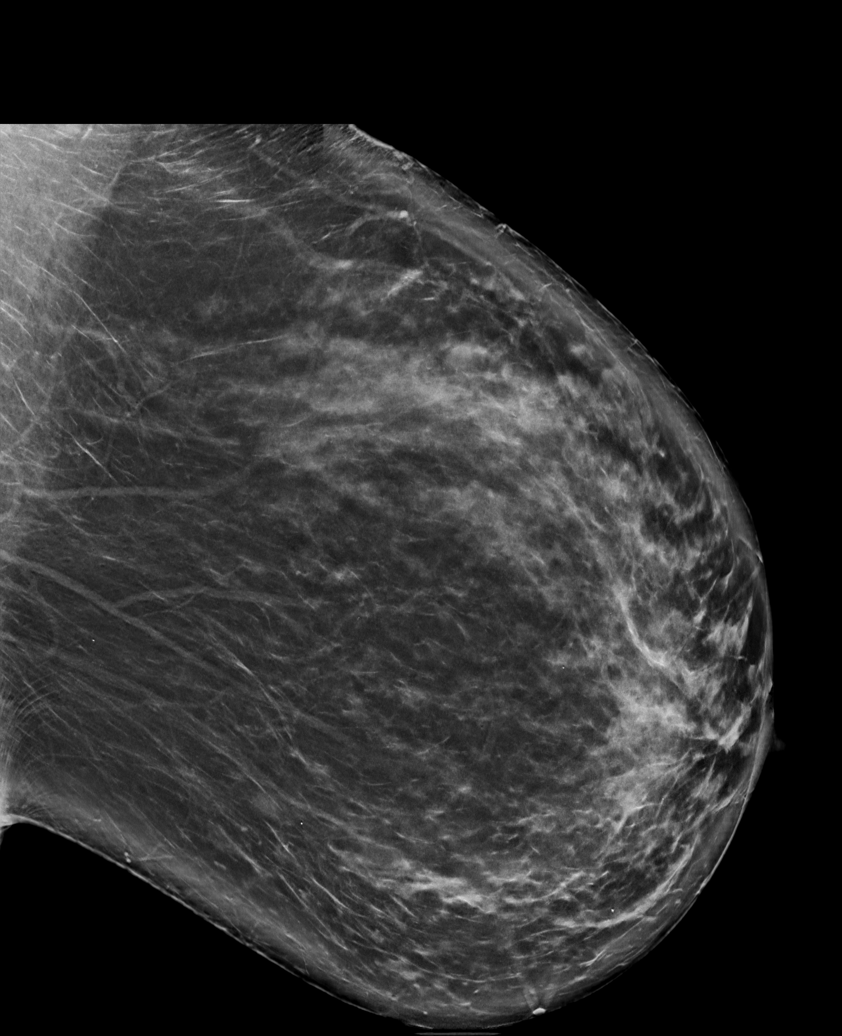

[R MLO synth-2D (1 of 2)]
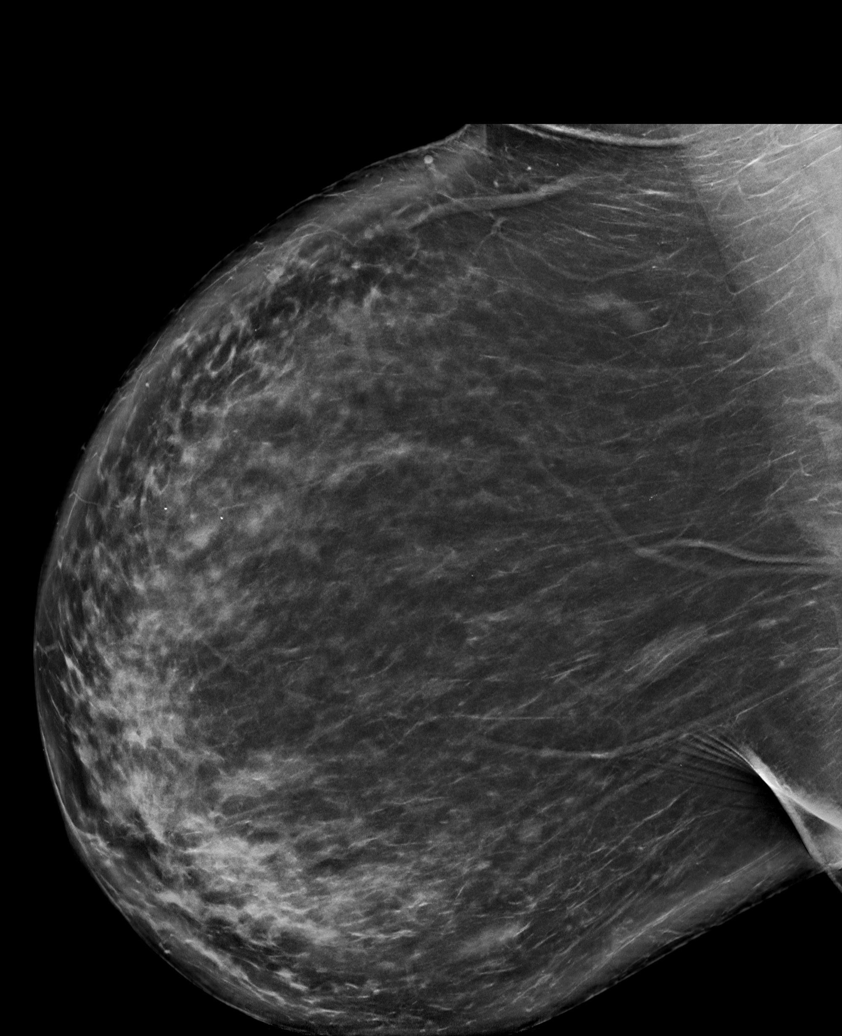

[R CC synth-2D]
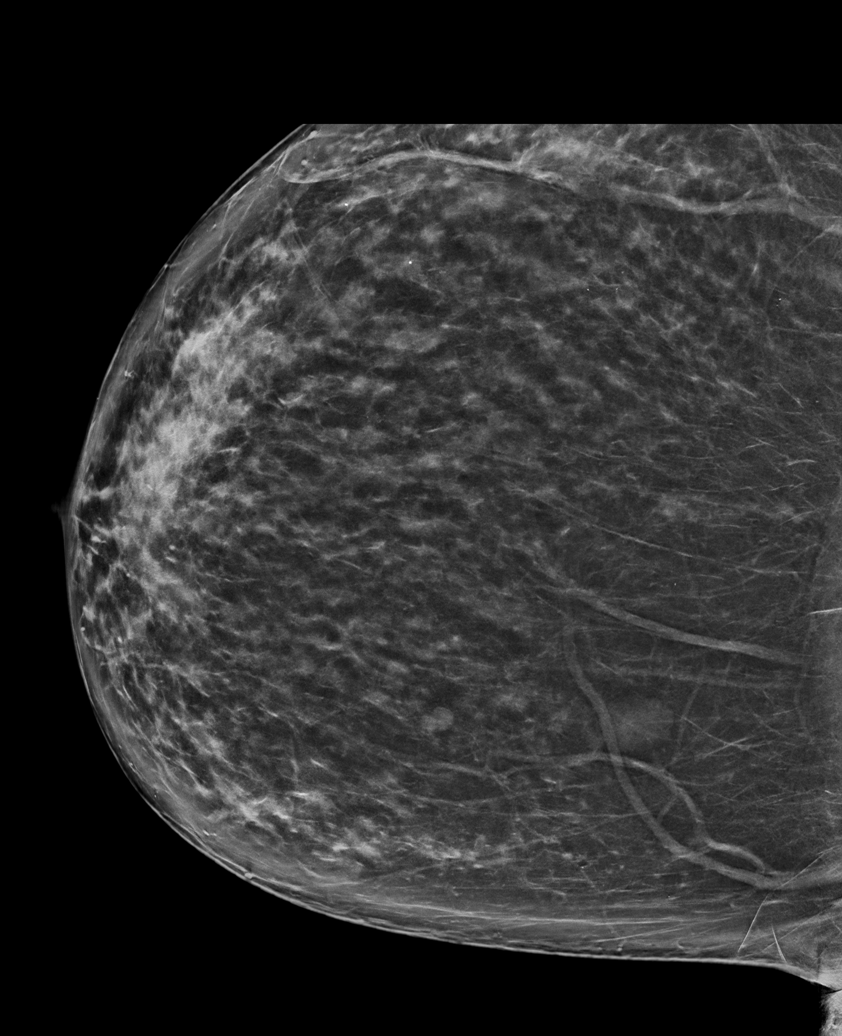

[R MLO synth-2D (2 of 2)]
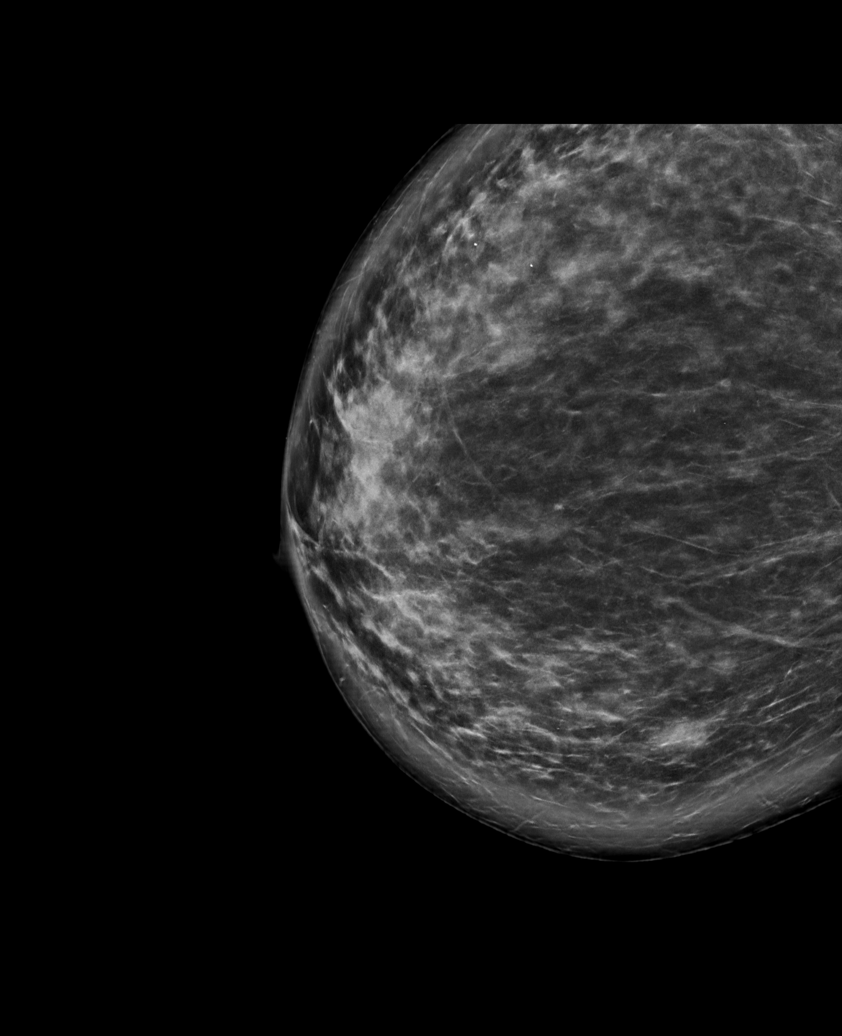

[L CC synth-2D]
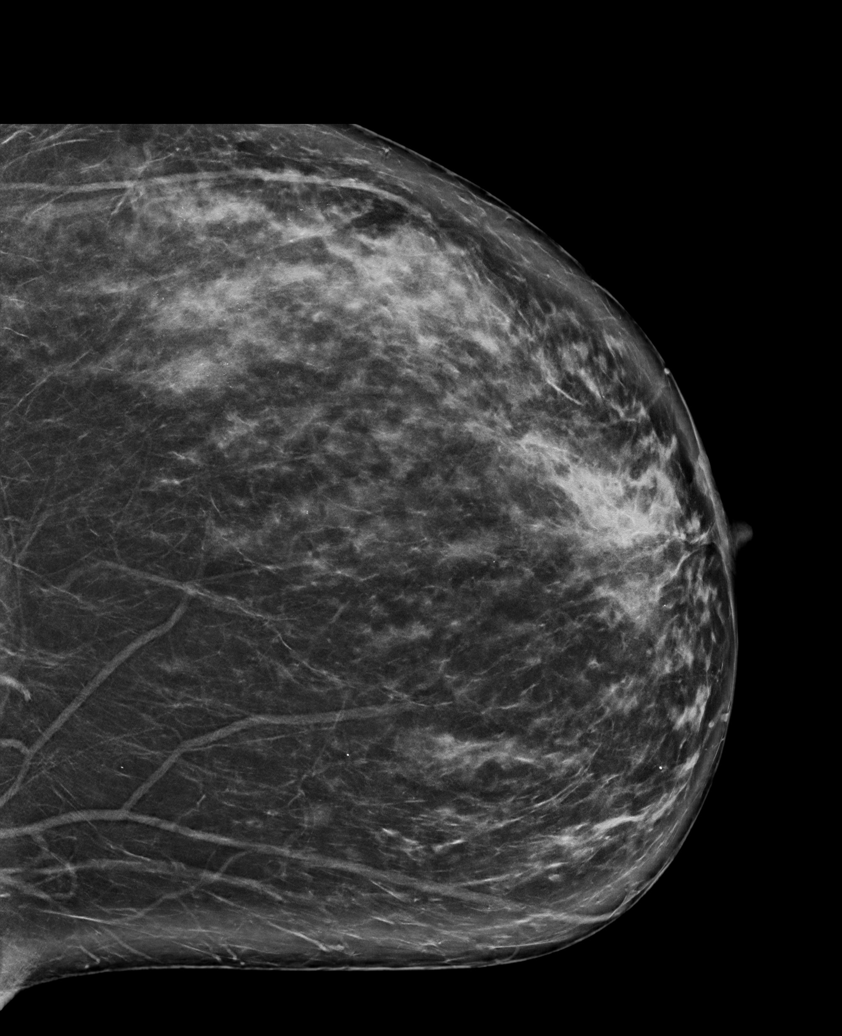

[6 of 31 positions shown; findings below may reference images not displayed]

ACR Breast Density Category c: The breast tissue is heterogeneously
dense, which may obscure small masses.
FINDINGS: There are no findings suspicious for malignancy.
IMPRESSION: No mammographic evidence of malignancy. A result letter of this
screening mammogram will be mailed directly to the patient.

RECOMMENDATION:
Screening mammogram in one year. (Code:Q3-W-BC3)

BI-RADS CATEGORY  1: Negative.

## 2024-05-11 ENCOUNTER — Other Ambulatory Visit (INDEPENDENT_AMBULATORY_CARE_PROVIDER_SITE_OTHER): Payer: Self-pay | Admitting: Otolaryngology
